# Patient Record
Sex: Female | Born: 1960 | Race: White | Hispanic: No | Marital: Married | Smoking: Never smoker
Health system: Southern US, Community
[De-identification: ages and names within clinical notes are randomized; demographics above are authoritative.]

## PROBLEM LIST (undated history)

## (undated) DIAGNOSIS — G4700 Insomnia, unspecified: Secondary | ICD-10-CM

## (undated) DIAGNOSIS — E042 Nontoxic multinodular goiter: Secondary | ICD-10-CM

## (undated) DIAGNOSIS — G4733 Obstructive sleep apnea (adult) (pediatric): Secondary | ICD-10-CM

## (undated) DIAGNOSIS — M545 Low back pain, unspecified: Secondary | ICD-10-CM

## (undated) DIAGNOSIS — N200 Calculus of kidney: Secondary | ICD-10-CM

## (undated) DIAGNOSIS — F419 Anxiety disorder, unspecified: Secondary | ICD-10-CM

## (undated) DIAGNOSIS — H35033 Hypertensive retinopathy, bilateral: Secondary | ICD-10-CM

## (undated) DIAGNOSIS — E669 Obesity, unspecified: Secondary | ICD-10-CM

## (undated) DIAGNOSIS — E041 Nontoxic single thyroid nodule: Secondary | ICD-10-CM

## (undated) DIAGNOSIS — Z8489 Family history of other specified conditions: Secondary | ICD-10-CM

## (undated) DIAGNOSIS — J45909 Unspecified asthma, uncomplicated: Secondary | ICD-10-CM

## (undated) DIAGNOSIS — E559 Vitamin D deficiency, unspecified: Secondary | ICD-10-CM

## (undated) DIAGNOSIS — I10 Essential (primary) hypertension: Secondary | ICD-10-CM

## (undated) DIAGNOSIS — Z6841 Body Mass Index (BMI) 40.0 and over, adult: Secondary | ICD-10-CM

## (undated) DIAGNOSIS — F4310 Post-traumatic stress disorder, unspecified: Secondary | ICD-10-CM

## (undated) DIAGNOSIS — E538 Deficiency of other specified B group vitamins: Secondary | ICD-10-CM

## (undated) DIAGNOSIS — G4719 Other hypersomnia: Secondary | ICD-10-CM

## (undated) DIAGNOSIS — M25561 Pain in right knee: Secondary | ICD-10-CM

## (undated) HISTORY — DX: Other hypersomnia: G47.19

## (undated) HISTORY — DX: Low back pain, unspecified: M54.50

## (undated) HISTORY — DX: Calculus of kidney: N20.0

## (undated) HISTORY — PX: CHOLECYSTECTOMY: SHX55

## (undated) HISTORY — DX: Anxiety disorder, unspecified: F41.9

## (undated) HISTORY — DX: Hypertensive retinopathy, bilateral: H35.033

## (undated) HISTORY — DX: Vitamin D deficiency, unspecified: E55.9

## (undated) HISTORY — DX: Insomnia, unspecified: G47.00

## (undated) HISTORY — DX: Obesity, unspecified: E66.9

## (undated) HISTORY — DX: Body Mass Index (BMI) 40.0 and over, adult: Z684

## (undated) HISTORY — PX: TONSILLECTOMY: SUR1361

## (undated) HISTORY — DX: Obstructive sleep apnea (adult) (pediatric): G47.33

## (undated) HISTORY — DX: Post-traumatic stress disorder, unspecified: F43.10

## (undated) HISTORY — DX: Nontoxic single thyroid nodule: E04.1

## (undated) HISTORY — DX: Nontoxic multinodular goiter: E04.2

## (undated) HISTORY — DX: Deficiency of other specified B group vitamins: E53.8

---
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
## 2003-03-13 ENCOUNTER — Other Ambulatory Visit: Admission: RE | Admit: 2003-03-13 | Discharge: 2003-03-13 | Payer: Self-pay | Admitting: Obstetrics and Gynecology

## 2003-06-11 ENCOUNTER — Ambulatory Visit (HOSPITAL_COMMUNITY): Admission: RE | Admit: 2003-06-11 | Discharge: 2003-06-11 | Payer: Self-pay | Admitting: Cardiology

## 2003-06-27 ENCOUNTER — Ambulatory Visit (HOSPITAL_COMMUNITY): Admission: RE | Admit: 2003-06-27 | Discharge: 2003-06-27 | Payer: Self-pay | Admitting: Family Medicine

## 2003-07-04 ENCOUNTER — Encounter (HOSPITAL_COMMUNITY): Admission: RE | Admit: 2003-07-04 | Discharge: 2003-10-02 | Payer: Self-pay | Admitting: Family Medicine

## 2004-06-06 ENCOUNTER — Other Ambulatory Visit: Admission: RE | Admit: 2004-06-06 | Discharge: 2004-06-06 | Payer: Self-pay | Admitting: Obstetrics and Gynecology

## 2004-06-10 ENCOUNTER — Ambulatory Visit (HOSPITAL_COMMUNITY): Admission: RE | Admit: 2004-06-10 | Discharge: 2004-06-10 | Payer: Self-pay | Admitting: Family Medicine

## 2010-12-15 ENCOUNTER — Other Ambulatory Visit: Payer: Self-pay | Admitting: Obstetrics and Gynecology

## 2010-12-15 DIAGNOSIS — N6019 Diffuse cystic mastopathy of unspecified breast: Secondary | ICD-10-CM

## 2010-12-15 DIAGNOSIS — N63 Unspecified lump in unspecified breast: Secondary | ICD-10-CM

## 2010-12-29 ENCOUNTER — Ambulatory Visit
Admission: RE | Admit: 2010-12-29 | Discharge: 2010-12-29 | Disposition: A | Discharge status: Home / Self Care | Payer: BC Managed Care – PPO | Source: Ambulatory Visit | Attending: Obstetrics and Gynecology | Admitting: Obstetrics and Gynecology

## 2010-12-29 DIAGNOSIS — N63 Unspecified lump in unspecified breast: Secondary | ICD-10-CM

## 2010-12-29 DIAGNOSIS — N6019 Diffuse cystic mastopathy of unspecified breast: Secondary | ICD-10-CM

## 2011-02-10 ENCOUNTER — Other Ambulatory Visit: Payer: Self-pay | Admitting: Family Medicine

## 2011-02-10 DIAGNOSIS — M542 Cervicalgia: Secondary | ICD-10-CM

## 2011-02-11 ENCOUNTER — Ambulatory Visit
Admission: RE | Admit: 2011-02-11 | Discharge: 2011-02-11 | Disposition: A | Discharge status: Home / Self Care | Payer: BC Managed Care – PPO | Source: Ambulatory Visit | Attending: Family Medicine | Admitting: Family Medicine

## 2011-02-11 DIAGNOSIS — M542 Cervicalgia: Secondary | ICD-10-CM

## 2012-09-02 ENCOUNTER — Emergency Department (HOSPITAL_COMMUNITY)
Admission: EM | Admit: 2012-09-02 | Discharge: 2012-09-03 | Disposition: A | Discharge status: Home / Self Care | Payer: BC Managed Care – PPO | Attending: Emergency Medicine | Admitting: Emergency Medicine

## 2012-09-02 ENCOUNTER — Emergency Department (HOSPITAL_COMMUNITY): Payer: BC Managed Care – PPO

## 2012-09-02 ENCOUNTER — Encounter (HOSPITAL_COMMUNITY): Payer: Self-pay | Admitting: Emergency Medicine

## 2012-09-02 DIAGNOSIS — S139XXA Sprain of joints and ligaments of unspecified parts of neck, initial encounter: Secondary | ICD-10-CM | POA: Insufficient documentation

## 2012-09-02 DIAGNOSIS — Y929 Unspecified place or not applicable: Secondary | ICD-10-CM | POA: Insufficient documentation

## 2012-09-02 DIAGNOSIS — M79609 Pain in unspecified limb: Secondary | ICD-10-CM | POA: Insufficient documentation

## 2012-09-02 DIAGNOSIS — R51 Headache: Secondary | ICD-10-CM | POA: Insufficient documentation

## 2012-09-02 DIAGNOSIS — S161XXA Strain of muscle, fascia and tendon at neck level, initial encounter: Secondary | ICD-10-CM

## 2012-09-02 DIAGNOSIS — R0602 Shortness of breath: Secondary | ICD-10-CM | POA: Insufficient documentation

## 2012-09-02 DIAGNOSIS — Y9389 Activity, other specified: Secondary | ICD-10-CM | POA: Insufficient documentation

## 2012-09-02 DIAGNOSIS — X500XXA Overexertion from strenuous movement or load, initial encounter: Secondary | ICD-10-CM | POA: Insufficient documentation

## 2012-09-02 DIAGNOSIS — R079 Chest pain, unspecified: Secondary | ICD-10-CM | POA: Insufficient documentation

## 2012-09-02 LAB — CBC WITH DIFFERENTIAL/PLATELET
Basophils Absolute: 0 10*3/uL (ref 0.0–0.1)
Eosinophils Absolute: 0 10*3/uL (ref 0.0–0.7)
Lymphocytes Relative: 26 % (ref 12–46)
MCH: 28.2 pg (ref 26.0–34.0)
Monocytes Absolute: 0.4 10*3/uL (ref 0.1–1.0)
Monocytes Relative: 5 % (ref 3–12)
Neutro Abs: 4.8 10*3/uL (ref 1.7–7.7)
WBC: 7 10*3/uL (ref 4.0–10.5)

## 2012-09-02 LAB — COMPREHENSIVE METABOLIC PANEL
AST: 13 U/L (ref 0–37)
Albumin: 4.1 g/dL (ref 3.5–5.2)
BUN: 8 mg/dL (ref 6–23)
Calcium: 9.9 mg/dL (ref 8.4–10.5)
Potassium: 4.2 mEq/L (ref 3.5–5.1)
Total Protein: 7.7 g/dL (ref 6.0–8.3)

## 2012-09-02 LAB — POCT I-STAT TROPONIN I: Troponin i, poc: 0 ng/mL (ref 0.00–0.08)

## 2012-09-02 MED ORDER — NAPROXEN 500 MG PO TABS: 500.0000 mg | ORAL_TABLET | Freq: Two times a day (BID) | ORAL | Status: DC

## 2012-09-02 MED ORDER — IOHEXOL 350 MG/ML SOLN
100.0000 mL | Freq: Once | INTRAVENOUS | Status: AC | PRN
  Administered 2012-09-02: 100 mL via INTRAVENOUS

## 2012-09-02 MED ORDER — CYCLOBENZAPRINE HCL 10 MG PO TABS: 10.0000 mg | ORAL_TABLET | Freq: Two times a day (BID) | ORAL | Status: DC | PRN

## 2012-09-02 NOTE — ED Notes (Signed)
Pt c/o l side chest pain that radiates to neck x5 days.  Pt also reports jaw pain x1 day.  Prior to the chest pain, pt reports leg pain.  Pt denies cardiac issues.

## 2012-09-02 NOTE — Progress Notes (Signed)
Patient reports her pcp is Dr. Joycelyn Rua of [facility].

## 2012-09-02 NOTE — ED Provider Notes (Addendum)
CSN: [id number]     Arrival date & time 09/02/12  1946 History     First MD Initiated Contact with Patient 09/02/12 2022     Chief Complaint  Patient presents with  . Neck Pain  . Chest Pain  . Leg Pain   (Consider location/radiation/quality/duration/timing/severity/associated sxs/prior Treatment) HPI Comments: Pt presented due to neck pain that started today when bending over to pick up a box.  States that she also has a mild HA but no radiation of the pain in the left arm but does radiate to the left jaw.  Worse with bending over and positions.  No cough, fever or numbness/tingling in the arm or leg.  No vision changes.  No prior hx of neck pain in the past.  Also pt states she recently flew back from [place] last week and for the last 5 days has had throbbing pain in her right calf that resolved today and right sided chest pain that she has had in the past with some SOB which has also resolved today.  Denies any pain in either of these areas today.  No leg swelling and no prior hx of blood clot.  The history is provided by the patient.    History reviewed. No pertinent past medical history. Past Surgical History  Procedure Laterality Date  . Tonsillectomy    . Cesarean section    . Cholecystectomy     No family history on file. History  Substance Use Topics  . Smoking status: Never Smoker   . Smokeless tobacco: Not on file  . Alcohol Use: No   OB History   Grav Para Term Preterm Abortions TAB SAB Ect Mult Living                 Review of Systems  Constitutional: Negative for fever.  HENT: Positive for neck pain. Negative for congestion.   Eyes: Negative for pain and visual disturbance.  Respiratory: Positive for shortness of breath. Negative for cough.   Cardiovascular: Positive for chest pain. Negative for leg swelling.  Gastrointestinal: Negative for nausea, vomiting and abdominal pain.  Neurological: Negative for dizziness, seizures, syncope, speech difficulty,  weakness and numbness.       Mild HA today no greater than a 2/10  All other systems reviewed and are negative.    Allergies  Review of patient's allergies indicates not on file.  Home Medications  No current outpatient prescriptions on file. BP 150/100  Pulse 83  Temp(Src) 98.9 F (37.2 C) (Oral)  Resp 16  SpO2 97%  LMP 08/11/2012 Physical Exam  Nursing note and vitals reviewed. Constitutional: She is oriented to person, place, and time. She appears well-developed and well-nourished. No distress.  HENT:  Head: Normocephalic and atraumatic.  Eyes: EOM are normal. Pupils are equal, round, and reactive to light.  Neck: Normal carotid pulses and no JVD present. Carotid bruit is not present.    Cardiovascular: Normal rate, regular rhythm, normal heart sounds and intact distal pulses.  Exam reveals no friction rub.   No murmur heard. Pulmonary/Chest: Effort normal and breath sounds normal. She has no wheezes. She has no rales.  Abdominal: Soft. Bowel sounds are normal. She exhibits no distension. There is no tenderness. There is no rebound and no guarding.  Musculoskeletal: Normal range of motion. She exhibits no tenderness.  No edema.  No right calf pain or swelling at this time  Neurological: She is alert and oriented to person, place, and time. She has normal strength.  No cranial nerve deficit or sensory deficit.  Skin: Skin is warm and dry. No rash noted.  Psychiatric: She has a normal mood and affect. Her behavior is normal.    ED Course   Procedures (including critical care time)  Labs Reviewed  COMPREHENSIVE METABOLIC PANEL - Abnormal; Notable for the following:    Glucose, Bld 100 (*)    All other components within normal limits  CBC WITH DIFFERENTIAL  D-DIMER, QUANTITATIVE  POCT I-STAT TROPONIN I   Dg Chest 1 View  09/02/2012   *RADIOLOGY REPORT*  Clinical Data: Chest pain.  Current history of asthma.  PORTABLE CHEST - 1 VIEW  Comparison: CT chest 06/11/2011.   Findings: Heart size upper normal to slightly enlarged for AP portable technique, unchanged.  Lungs clear.  Bronchovascular markings normal.  Pulmonary vascularity normal.  No pneumothorax. No pleural effusions.  IMPRESSION: Borderline heart size.  No acute cardiopulmonary disease.   Original Report Authenticated By: Hulan Saas, M.D.   Ct Angio Neck W/cm &/or Wo/cm  09/02/2012   *RADIOLOGY REPORT*  Clinical Data:  LEFT-SIDED CHEST PAIN THAT RADIATES AND NECK  CT ANGIOGRAPHY NECK  Technique:  Multidetector CT imaging of the neck was performed using the standard protocol during bolus administration of intravenous contrast.  Multiplanar CT image reconstructions including MIPs were obtained to evaluate the vascular anatomy. Carotid stenosis measurements (when applicable) are obtained utilizing NASCET criteria, using the distal internal carotid diameter as the denominator.  Contrast: OMNIPAQUE IOHEXOL 350 MG/ML SOLN  Comparison:   None.  Findings: The aortic arch is normal in appearance with normal three- vessel morphology.  The subclavian arteries are normal.  The common carotid arteries are well opacified without evidence of high-grade stenosis or dissection. On the right, the right carotid bifurcation is normal.  The right internal and external carotid arteries are well opacified without evidence of high-grade stenosis, dissection, or aneurysm. More short segment decreased attenuation within the proximal right internal carotid artery may represent focal area of atherosclerosis (series 6 series 2, image 28).  On the left, the left internal carotid artery is minimally diminutive as compared to the right.  There is no evidence of dissection, high-grade stenosis, or aneurysm.  There is slight medialization of the internal carotid arteries bilaterally at the level of the hypopharynx, just distal to the carotid bifurcation.  The vertebral arteries are well opacified without evidence of stenosis, dissection, or  aneurysm.  The visualized intracranial circulation is normal.   Review of the MIP images confirms the above findings.  No pathologically enlarged lymph nodes are identified within the neck.  No loculated fluid collections or mass lesions are seen.  The thyroid is heterogeneous in appearance with multiple previously predominately hypodense nodules seen bilaterally.  The largest of these on the left measures 1.6 x 1.9 cm.  The largest of these on the right measures 1.4 x 1.6 cm with scattered internal calcifications.  No acute osseous abnormalities are identified.  The partially visualized lung apices are grossly unremarkable.  IMPRESSION: 1.  No CT evidence of carotid artery dissection identified.  No high-grade stenosis or aneurysm identified in the neck.  2. Heterogeneous thyroid with multiple nodules bilaterally as above. Further evaluation with dedicated thyroid ultrasound is recommended as clinically indicated.   Original Report Authenticated By: Rise Mu, M.D.     Date: 09/02/2012  Rate: 85  Rhythm: normal sinus rhythm  QRS Axis: normal  Intervals: normal  ST/T Wave abnormalities: normal  Conduction Disutrbances: none  Narrative  Interpretation: unremarkable     1. Strain of sternocleidomastoid muscle, initial encounter     MDM   Patient with unusual story of left-sided neck pain that occurred when she bent over her and has worsened all day today. No neurologic symptoms with the pain no carotid bruits on exam but concern for possible vertebral dissection.  Patient also states she recently flew back from [place] and been having right calf pain and right-sided chest pain denied shortness of breath. She states the chest pain and leg pain have improved but now the neck pain is severe. No prior history of cardiac issues no family history of clots low risk well's and only finding on exam is pain over the left steroncleidomastoid muscle.  EKG wnl.  Low suspicion for MI or cardiac issue.   Concern for possible PE however patient is low risk and will do a d-dimer. Concern for possible vertebral dissection as the cause of her neck pain versus muscle spasm as she does lift heavy things routinely.  CBC, CMP, troponin, d-dimer and CXR pending.  11:40 PM Labs and all imaging wnl.  Will d/c with muscle relaxers and NSAIDs  Gwyneth Sprout, MD 09/02/12 7829  Gwyneth Sprout, MD 09/02/12 2342

## 2018-09-26 ENCOUNTER — Encounter: Payer: Self-pay | Admitting: Orthopedic Surgery

## 2018-09-26 ENCOUNTER — Ambulatory Visit (INDEPENDENT_AMBULATORY_CARE_PROVIDER_SITE_OTHER): Payer: 59 | Admitting: Orthopedic Surgery

## 2018-09-26 VITALS — Ht 67.5 in | Wt 254.0 lb

## 2018-09-26 DIAGNOSIS — M1711 Unilateral primary osteoarthritis, right knee: Secondary | ICD-10-CM | POA: Diagnosis not present

## 2018-09-26 MED ORDER — METHYLPREDNISOLONE ACETATE 40 MG/ML IJ SUSP
40.0000 mg | INTRAMUSCULAR | Status: AC | PRN
  Administered 2018-09-26: 12:00:00 40 mg via INTRA_ARTICULAR

## 2018-09-26 MED ORDER — LIDOCAINE HCL 1 % IJ SOLN
5.0000 mL | INTRAMUSCULAR | Status: AC | PRN
  Administered 2018-09-26: 12:00:00 5 mL

## 2018-09-26 NOTE — Progress Notes (Signed)
Office Visit Note   Patient: Patricia Robbins           Date of Birth: 03/28/1960           MRN: [id number] Visit Date: 09/26/2018              Requested by: Joycelyn RuaMeyers, Stephen, MD [address] [place],  [place] [id number] PCP: Joycelyn RuaMeyers, Stephen, MD  Chief Complaint  Patient presents with  . Right Knee - Pain    NP; Baker's Cyst      HPI: Patient is a 58 year old woman who presents for evaluation of osteoarthritis of her right knee.  Patient [place]  She states that her knee feels stiff and swollen and cannot bend her knee.  She states she has seen [place] [facility] and was told that she does have a very small Baker's cyst.  She is also undergone a Doppler which was negative for DVT.  Patient states she is tried meloxicam which gave her significant side effects she states she is also [facility] by a PA lum.  Patient states she has had chronic plantar fasciitis in her right knee pain is primarily over the medial joint line.  Assessment & Plan: Visit Diagnoses:  1. Unilateral primary osteoarthritis, right knee     Plan: Right knee was injected she tolerated this well reevaluate in 4 weeks.  Follow-Up Instructions: Return in about 4 weeks (around 10/24/2018).   Ortho Exam  Patient is alert, oriented, no adenopathy, well-dressed, normal affect, normal respiratory effort. Examination patient's difficulty getting from a sitting to a standing position.  Her calf is soft nontender no evidence of DVT no skin color changes no evidence of chronic venous insufficiency.  She has no significant prominent in the popliteal fossa no evidence of a large DVT.  There is minimal swelling of the knee she is maximally tender to palpation of the medial joint line.  Collaterals and cruciates are stable she does have crepitation with range of motion.  Her radiographs were reviewed which show some mild  joint space narrowing medially and laterally however she does have osteophytic bone spurs at all 3 compartments.  Imaging: No results found. No images are attached to the encounter.  Labs: No results found for: HGBA1C, ESRSEDRATE, CRP, LABURIC, REPTSTATUS, GRAMSTAIN, CULT, LABORGA   Lab Results  Component Value Date   ALBUMIN 4.1 09/02/2012    No results found for: MG No results found for: VD25OH  No results found for: PREALBUMIN CBC EXTENDED Latest Ref Rng & Units 09/02/2012  WBC 4.0 - 10.5 K/uL 7.0  RBC 3.87 - 5.11 MIL/uL 4.65  HGB 12.0 - 15.0 g/dL 19.113.1  HCT 47.836.0 - 29.546.0 % 39.2  PLT 150 - 400 K/uL 246  NEUTROABS 1.7 - 7.7 K/uL 4.8  LYMPHSABS 0.7 - 4.0 K/uL 1.8     Body mass index is 39.19 kg/m.  Orders:  No orders of the defined types were placed in this encounter.  No orders of the defined types were placed in this encounter.    Procedures: Large Joint Inj: R knee on 09/26/2018 12:15 PM Indications: pain and diagnostic evaluation Details: 22 G 1.5 in needle, anteromedial approach  Arthrogram: No  Medications: 5 mL lidocaine 1 %; 40 mg methylPREDNISolone acetate 40 MG/ML Outcome: tolerated well, no immediate complications Procedure, treatment alternatives, risks and benefits explained, specific risks discussed. Consent  was given by the patient. Immediately prior to procedure a time out was called to verify the correct patient, procedure, equipment, support staff and site/side marked as required. Patient was prepped and draped in the usual sterile fashion.      Clinical Data: No additional findings.  ROS:  All other systems negative, except as noted in the HPI. Review of Systems  Objective: Vital Signs: Ht 5' 7.5" (1.715 m)   Wt 254 lb (115.2 kg)   BMI 39.19 kg/m   Specialty Comments:  No specialty comments available.  PMFS History: There are no active problems to display for this patient.  History reviewed. No pertinent past medical history.   History reviewed. No pertinent family history.  Past Surgical History:  Procedure Laterality Date  . CESAREAN SECTION    . CHOLECYSTECTOMY    . TONSILLECTOMY     Social History   Occupational History  . Not on file  Tobacco Use  . Smoking status: Never Smoker  . Smokeless tobacco: Never Used  Substance and Sexual Activity  . Alcohol use: No  . Drug use: No  . Sexual activity: Yes

## 2018-10-24 ENCOUNTER — Ambulatory Visit (INDEPENDENT_AMBULATORY_CARE_PROVIDER_SITE_OTHER): Payer: 59 | Admitting: Orthopedic Surgery

## 2018-10-24 ENCOUNTER — Encounter: Payer: Self-pay | Admitting: Orthopedic Surgery

## 2018-10-24 VITALS — Ht 67.5 in | Wt 254.0 lb

## 2018-10-24 DIAGNOSIS — M1711 Unilateral primary osteoarthritis, right knee: Secondary | ICD-10-CM | POA: Diagnosis not present

## 2018-10-24 NOTE — Progress Notes (Signed)
Office Visit Note   Patient: Patricia Robbins           Date of Birth: 30-May-1960           MRN: [id number] Visit Date: 10/24/2018              Requested by: Orpah Melter, [place],   [id number] PCP: Orpah Melter, MD  Chief Complaint  Patient presents with  . Right Knee - Pain, Follow-up      HPI: Patient is a 58 year old woman who presents in follow-up for osteoarthritis right knee she states the injection helped a lot she states her knee feels much better.  She states she also has some swelling in both legs right worse than left.  Patient states that she is going to [place] [facility] for possible further ablation.  Assessment & Plan: Visit Diagnoses:  1. Unilateral primary osteoarthritis, right knee     Plan: Recommended compression stockings recommended that she follow-up with her primary doctor for management of diuretics as well as her ACE inhibitor.  Patient states that her diastolic blood pressure runs around 100.  Discussed weight loss follow-up as needed.  Follow-Up Instructions: Return if symptoms worsen or fail to improve.   Ortho Exam  Patient is alert, oriented, no adenopathy, well-dressed, normal affect, normal respiratory effort. Examination patient has venous stasis swelling worse on the right than the left leg there is no open ulcers.  She has no brawny skin color changes she does have pitting edema in both legs she has good range of motion of the right knee there is no knee effusion.  Imaging: No results found. No images are attached to the encounter.  Labs: No results found for: HGBA1C, ESRSEDRATE, CRP, LABURIC, REPTSTATUS, GRAMSTAIN, CULT, LABORGA   Lab Results  Component Value Date   ALBUMIN 4.1 09/02/2012    No results found for: MG No results found for: VD25OH  No results found for: PREALBUMIN CBC EXTENDED Latest Ref Rng & Units 09/02/2012  WBC 4.0 - 10.5 K/uL 7.0  RBC 3.87 - 5.11 MIL/uL 4.65  HGB 12.0  - 15.0 g/dL 13.1  HCT 36.0 - 46.0 % 39.2  PLT 150 - 400 K/uL 246  NEUTROABS 1.7 - 7.7 K/uL 4.8  LYMPHSABS 0.7 - 4.0 K/uL 1.8     Body mass index is 39.19 kg/m.  Orders:  No orders of the defined types were placed in this encounter.  No orders of the defined types were placed in this encounter.    Procedures: No procedures performed  Clinical Data: No additional findings.  ROS:  All other systems negative, except as noted in the HPI. Review of Systems  Objective: Vital Signs: Ht 5' 7.5" (1.715 m)   Wt 254 lb (115.2 kg)   BMI 39.19 kg/m   Specialty Comments:  No specialty comments available.  PMFS History: There are no active problems to display for this patient.  History reviewed. No pertinent past medical history.  History reviewed. No pertinent family history.  Past Surgical History:  Procedure Laterality Date  . CESAREAN SECTION    . CHOLECYSTECTOMY    . TONSILLECTOMY     Social History   Occupational History  . Not on file  Tobacco Use  . Smoking status: Never Smoker  . Smokeless tobacco: Never Used  Substance and Sexual Activity  . Alcohol use: No  . Drug use: No  . Sexual activity: Yes

## 2019-01-10 ENCOUNTER — Other Ambulatory Visit: Payer: Self-pay

## 2019-01-10 ENCOUNTER — Telehealth: Payer: Self-pay | Admitting: Orthopedic Surgery

## 2019-01-10 DIAGNOSIS — M1711 Unilateral primary osteoarthritis, right knee: Secondary | ICD-10-CM

## 2019-01-10 NOTE — Telephone Encounter (Signed)
Please order MRI scan right knee rule out meniscal tear.

## 2019-01-10 NOTE — Telephone Encounter (Signed)
I called pt and advised that the order has been put in for MRi. Will call insurance and precert procedure and then call her back to schedule procedure. Pt voiced understanding and will call with any other questions.

## 2019-01-10 NOTE — Telephone Encounter (Signed)
Patient's right knee pain has not improved and would like to know if Dr. Sharol Given would order an MRI.  She is afraid to get another injection in fear that she might hurt herself again with not knowing how much pain she is in.  CB#[phone].  Thank you.

## 2019-01-10 NOTE — Telephone Encounter (Signed)
Can you please see below and advise?  

## 2019-01-30 ENCOUNTER — Other Ambulatory Visit: Payer: 59

## 2019-02-02 ENCOUNTER — Ambulatory Visit
Admission: RE | Admit: 2019-02-02 | Discharge: 2019-02-02 | Disposition: A | Discharge status: Home / Self Care | Payer: 59 | Source: Ambulatory Visit | Attending: Orthopedic Surgery | Admitting: Orthopedic Surgery

## 2019-02-02 ENCOUNTER — Other Ambulatory Visit: Payer: Self-pay

## 2019-02-02 DIAGNOSIS — M1711 Unilateral primary osteoarthritis, right knee: Secondary | ICD-10-CM

## 2019-02-06 ENCOUNTER — Encounter: Payer: Self-pay | Admitting: Orthopedic Surgery

## 2019-02-06 ENCOUNTER — Ambulatory Visit (INDEPENDENT_AMBULATORY_CARE_PROVIDER_SITE_OTHER): Payer: 59 | Admitting: Orthopedic Surgery

## 2019-02-06 ENCOUNTER — Other Ambulatory Visit: Payer: Self-pay

## 2019-02-06 VITALS — Ht 67.0 in | Wt 254.0 lb

## 2019-02-06 DIAGNOSIS — M23221 Derangement of posterior horn of medial meniscus due to old tear or injury, right knee: Secondary | ICD-10-CM

## 2019-02-09 ENCOUNTER — Other Ambulatory Visit: Payer: 59

## 2019-02-14 ENCOUNTER — Encounter: Payer: Self-pay | Admitting: Orthopedic Surgery

## 2019-02-14 NOTE — Progress Notes (Signed)
Office Visit Note   Patient: Patricia Robbins           Date of Birth: 08/07/1960           MRN: [id number] Visit Date: 02/06/2019              Requested by: Joycelyn Rua, MD [address] [place],  [place] [address] PCP: Joycelyn Rua, MD  Chief Complaint  Patient presents with  . Right Knee - Follow-up    MRI review      HPI: Patient is a 59 year old woman who presents in follow-up for mechanical pain right knee status post MRI scan.  She complains of increased swelling and pain along the medial joint line.  Assessment & Plan: Visit Diagnoses:  1. Derang of post horn of medial mensc d/t old tear/inj, r knee     Plan: Patient states that due to failure of conservative treatment she would like to proceed with arthroscopic intervention.  Risk and benefits were discussed including persistent pain DVT need for additional surgery.  Patient states she understands wishes to proceed at this time.  Follow-Up Instructions: Return in about 2 weeks (around 02/20/2019).   Ortho Exam  Patient is alert, oriented, no adenopathy, well-dressed, normal affect, normal respiratory effort. Examination patient has swelling of the right knee she is tender to palpation of the medial joint line flexion rotation is painful.  Review of the MRI scan shows intact ligaments she has a complex degenerative tear of the posterior horn of the medial meniscus there is also some osteoarthritis in the medial joint line as well as the patellofemoral joint she does have a Baker's cyst.  Imaging: No results found. No images are attached to the encounter.  Labs: No results found for: HGBA1C, ESRSEDRATE, CRP, LABURIC, REPTSTATUS, GRAMSTAIN, CULT, LABORGA   Lab Results  Component Value Date   ALBUMIN 4.1 09/02/2012    No results found for: MG No results found for: VD25OH  No results found for: PREALBUMIN CBC EXTENDED Latest Ref Rng & Units 09/02/2012  WBC 4.0 - 10.5 K/uL 7.0  RBC 3.87 - 5.11  MIL/uL 4.65  HGB 12.0 - 15.0 g/dL 14.4  HCT 81.8 - 56.3 % 39.2  PLT 150 - 400 K/uL 246  NEUTROABS 1.7 - 7.7 K/uL 4.8  LYMPHSABS 0.7 - 4.0 K/uL 1.8     Body mass index is 39.78 kg/m.  Orders:  No orders of the defined types were placed in this encounter.  No orders of the defined types were placed in this encounter.    Procedures: No procedures performed  Clinical Data: No additional findings.  ROS:  All other systems negative, except as noted in the HPI. Review of Systems  Objective: Vital Signs: Ht 5\' 7"  (1.702 m)   Wt 254 lb (115.2 kg)   BMI 39.78 kg/m   Specialty Comments:  No specialty comments available.  PMFS History: There are no problems to display for this patient.  History reviewed. No pertinent past medical history.  History reviewed. No pertinent family history.  Past Surgical History:  Procedure Laterality Date  . CESAREAN SECTION    . CHOLECYSTECTOMY    . TONSILLECTOMY     Social History   Occupational History  . Not on file  Tobacco Use  . Smoking status: Never Smoker  . Smokeless tobacco: Never Used  Substance and Sexual Activity  . Alcohol use: No  . Drug use: No  . Sexual activity: Yes

## 2019-03-06 ENCOUNTER — Other Ambulatory Visit: Payer: Self-pay

## 2019-03-09 ENCOUNTER — Other Ambulatory Visit: Payer: Self-pay | Admitting: Physician Assistant

## 2019-03-27 ENCOUNTER — Other Ambulatory Visit: Payer: Self-pay

## 2019-03-27 ENCOUNTER — Encounter (HOSPITAL_BASED_OUTPATIENT_CLINIC_OR_DEPARTMENT_OTHER): Payer: Self-pay | Admitting: Orthopedic Surgery

## 2019-03-31 ENCOUNTER — Encounter (HOSPITAL_BASED_OUTPATIENT_CLINIC_OR_DEPARTMENT_OTHER)
Admission: RE | Admit: 2019-03-31 | Discharge: 2019-03-31 | Disposition: A | Discharge status: Home / Self Care | Payer: 59 | Source: Ambulatory Visit | Attending: Orthopedic Surgery | Admitting: Orthopedic Surgery

## 2019-03-31 ENCOUNTER — Inpatient Hospital Stay (HOSPITAL_COMMUNITY): Admission: RE | Admit: 2019-03-31 | Payer: 59 | Source: Ambulatory Visit

## 2019-03-31 ENCOUNTER — Telehealth: Payer: Self-pay | Admitting: Orthopedic Surgery

## 2019-03-31 NOTE — Telephone Encounter (Signed)
Patient called.  She needs to know what stage her right miniscule tear is in. Her other providers are inquiring.   Call back: [phone]

## 2019-03-31 NOTE — Telephone Encounter (Signed)
Pt is sch for surgery 04/04/19 please see below and advise.

## 2019-03-31 NOTE — Telephone Encounter (Signed)
Patient called and stated to disregard previous request. Information no longer needed.

## 2019-04-01 ENCOUNTER — Inpatient Hospital Stay (HOSPITAL_COMMUNITY): Admission: RE | Admit: 2019-04-01 | Payer: 59 | Source: Ambulatory Visit

## 2019-04-04 ENCOUNTER — Ambulatory Visit (HOSPITAL_BASED_OUTPATIENT_CLINIC_OR_DEPARTMENT_OTHER): Admission: RE | Admit: 2019-04-04 | Payer: 59 | Source: Home / Self Care | Admitting: Orthopedic Surgery

## 2019-04-04 HISTORY — DX: Essential (primary) hypertension: I10

## 2019-04-04 HISTORY — DX: Pain in right knee: M25.561

## 2019-04-04 HISTORY — DX: Family history of other specified conditions: Z84.89

## 2019-04-04 HISTORY — DX: Unspecified asthma, uncomplicated: J45.909

## 2019-04-04 SURGERY — ARTHROSCOPY, KNEE
Anesthesia: Choice | Site: Knee | Laterality: Right

## 2019-04-13 ENCOUNTER — Inpatient Hospital Stay: Payer: 59 | Admitting: Orthopedic Surgery

## 2020-10-03 ENCOUNTER — Other Ambulatory Visit: Payer: Self-pay | Admitting: Radiology

## 2020-10-03 DIAGNOSIS — N644 Mastodynia: Secondary | ICD-10-CM

## 2020-10-22 ENCOUNTER — Ambulatory Visit: Payer: 59

## 2020-10-22 ENCOUNTER — Other Ambulatory Visit: Payer: Self-pay

## 2020-10-22 ENCOUNTER — Ambulatory Visit
Admission: RE | Admit: 2020-10-22 | Discharge: 2020-10-22 | Disposition: A | Discharge status: Home / Self Care | Payer: 59 | Source: Ambulatory Visit | Attending: Radiology | Admitting: Radiology

## 2020-10-22 DIAGNOSIS — N644 Mastodynia: Secondary | ICD-10-CM

## 2021-02-25 ENCOUNTER — Ambulatory Visit (INDEPENDENT_AMBULATORY_CARE_PROVIDER_SITE_OTHER): Payer: 59 | Admitting: Family

## 2021-02-25 ENCOUNTER — Ambulatory Visit: Payer: Self-pay

## 2021-02-25 DIAGNOSIS — M25562 Pain in left knee: Secondary | ICD-10-CM | POA: Diagnosis not present

## 2021-02-25 DIAGNOSIS — G8929 Other chronic pain: Secondary | ICD-10-CM | POA: Diagnosis not present

## 2021-02-25 NOTE — Progress Notes (Signed)
Office Visit Note   Patient: Patricia Robbins           Date of Birth: October 15, 1960           MRN: [id number] Visit Date: 02/25/2021              Requested by: Christa See, [address] [place],  [place] [address] PCP: Christa See, FNP  Chief Complaint  Patient presents with   Left Knee - Pain      HPI: The patient is a 61 year old woman who presents today for initial evaluation of left knee pain.  This is been going on for many months she has even had a second injury which was just 2 months ago to the same knee her injuries have occurred while walking in the woods stepping "wrong on uneven terrain."  She has been having ongoing issues with medial sided knee pain some posterior knee pain she can worries that this may be a Baker's cyst she has had Baker's cyst on the right.  She is having anterior knee pain on the top of her kneecap and in the muscle she states this is C-shaped and radiates through the medial knee this feels like a burning pain  This is worse at night she has extreme difficulty getting comfortable in bed. has pain with extension  Assessment & Plan: Visit Diagnoses:  1. Chronic pain of left knee     Plan: We will proceed with MRI of the left knee.  Concern for meniscal injury.  Follow-Up Instructions: No follow-ups on file.   Left Knee Exam   Muscle Strength  The patient has normal left knee strength.  Tenderness  The patient is experiencing tenderness in the medial joint line.  Range of Motion  The patient has normal left knee ROM.  Tests  Varus: negative Valgus: negative  Other  Erythema: absent Effusion: no effusion present     Patient is alert, oriented, no adenopathy, well-dressed, normal affect, normal respiratory effort.   Imaging: No results found. No images are attached to the encounter.  Labs: No results found for: HGBA1C, ESRSEDRATE, CRP, LABURIC, REPTSTATUS, GRAMSTAIN, CULT, LABORGA   Lab Results  Component Value  Date   ALBUMIN 4.1 09/02/2012    No results found for: MG No results found for: VD25OH  No results found for: PREALBUMIN CBC EXTENDED Latest Ref Rng & Units 09/02/2012  WBC 4.0 - 10.5 K/uL 7.0  RBC 3.87 - 5.11 MIL/uL 4.65  HGB 12.0 - 15.0 g/dL 13.1  HCT 36.0 - 46.0 % 39.2  PLT 150 - 400 K/uL 246  NEUTROABS 1.7 - 7.7 K/uL 4.8  LYMPHSABS 0.7 - 4.0 K/uL 1.8     There is no height or weight on file to calculate BMI.  Orders:  Orders Placed This Encounter  Procedures   XR Knee 1-2 Views Left   MR Knee Left w/o contrast   No orders of the defined types were placed in this encounter.    Procedures: Large Joint Inj: L knee on 02/25/2021 10:50 AM Indications: pain Details: 18 G 1.5 in needle, anteromedial approach Medications: 5 mL lidocaine 1 %; 40 mg methylPREDNISolone acetate 40 MG/ML Consent was given by the patient.     Clinical Data: No additional findings.  ROS:  All other systems negative, except as noted in the HPI. Review of Systems  Constitutional:  Negative for appetite change and fever.  Musculoskeletal:  Positive for arthralgias, joint swelling and myalgias.  Objective: Vital Signs: There were no vitals taken for this visit.  Specialty Comments:  No specialty comments available.  PMFS History: There are no problems to display for this patient.  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  Past Surgical History:  Procedure Laterality Date   CESAREAN SECTION     CESAREAN SECTION     CHOLECYSTECTOMY     TONSILLECTOMY     Social History   Occupational History   Not on file  Tobacco Use   Smoking status: Never   Smokeless tobacco: Never  Substance and Sexual Activity   Alcohol use: Yes   Drug use: No   Sexual activity: Yes    Comment:  husband has had vasectomy

## 2021-03-06 ENCOUNTER — Ambulatory Visit (INDEPENDENT_AMBULATORY_CARE_PROVIDER_SITE_OTHER): Payer: 59 | Admitting: Orthopedic Surgery

## 2021-03-06 ENCOUNTER — Other Ambulatory Visit: Payer: Self-pay

## 2021-03-06 DIAGNOSIS — M25562 Pain in left knee: Secondary | ICD-10-CM | POA: Diagnosis not present

## 2021-03-06 DIAGNOSIS — G8929 Other chronic pain: Secondary | ICD-10-CM

## 2021-03-09 ENCOUNTER — Encounter: Payer: Self-pay | Admitting: Orthopedic Surgery

## 2021-03-09 NOTE — Progress Notes (Signed)
Office Visit Note   Patient: Patricia Robbins           Date of Birth: 1960/05/28           MRN: [id number] Visit Date: 03/06/2021              Requested by: Christa See, [address] [place],  [place] [address] PCP: Christa See, FNP  Chief Complaint  Patient presents with   Left Knee - Pain      HPI: Patient is a 61 year old woman who presents in follow-up for chronic left knee pain as well as pain anteriorly and posteriorly.  Patient states the injury occurred when she stepped on uneven terrain.  She has a history of a Baker's cyst in her right knee.  Radiographs were obtained on January 24 and she has an MRI scheduled for February 10.  Assessment & Plan: Visit Diagnoses:  1. Chronic pain of left knee     Plan: We will follow-up after the MRI scan.  Follow-Up Instructions: Return in about 1 week (around 03/13/2021).   Ortho Exam  Patient is alert, oriented, no adenopathy, well-dressed, normal affect, normal respiratory effort. Examination patient has no effusion collaterals and cruciates are stable she has crepitation with range of motion of the patellofemoral joint she complains of popping in her knee.  Pain primarily to palpation over the medial joint line pain with flexion and internal rotation.  Imaging: No results found. No images are attached to the encounter.  Labs: No results found for: HGBA1C, ESRSEDRATE, CRP, LABURIC, REPTSTATUS, GRAMSTAIN, CULT, LABORGA   Lab Results  Component Value Date   ALBUMIN 4.1 09/02/2012    No results found for: MG No results found for: VD25OH  No results found for: PREALBUMIN CBC EXTENDED Latest Ref Rng & Units 09/02/2012  WBC 4.0 - 10.5 K/uL 7.0  RBC 3.87 - 5.11 MIL/uL 4.65  HGB 12.0 - 15.0 g/dL 13.1  HCT 36.0 - 46.0 % 39.2  PLT 150 - 400 K/uL 246  NEUTROABS 1.7 - 7.7 K/uL 4.8  LYMPHSABS 0.7 - 4.0 K/uL 1.8     There is no height or weight on file to calculate BMI.  Orders:  No orders of the defined  types were placed in this encounter.  No orders of the defined types were placed in this encounter.    Procedures: No procedures performed  Clinical Data: No additional findings.  ROS:  All other systems negative, except as noted in the HPI. Review of Systems  Objective: Vital Signs: There were no vitals taken for this visit.  Specialty Comments:  No specialty comments available.  PMFS History: There are no problems to display for this patient.  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 No pertinent family history.  Past Surgical History:  Procedure Laterality Date   CESAREAN SECTION     CESAREAN SECTION     CHOLECYSTECTOMY     TONSILLECTOMY     Social History   Occupational History   Not on file  Tobacco Use   Smoking status: Never   Smokeless tobacco: Never  Substance and Sexual Activity   Alcohol use: Yes   Drug use: No   Sexual activity:  Yes    Comment: husband has had vasectomy

## 2021-03-14 ENCOUNTER — Other Ambulatory Visit: Payer: Self-pay

## 2021-03-14 ENCOUNTER — Ambulatory Visit
Admission: RE | Admit: 2021-03-14 | Discharge: 2021-03-14 | Disposition: A | Discharge status: Home / Self Care | Payer: 59 | Source: Ambulatory Visit | Attending: Family | Admitting: Family

## 2021-03-14 DIAGNOSIS — G8929 Other chronic pain: Secondary | ICD-10-CM

## 2021-03-14 DIAGNOSIS — M25562 Pain in left knee: Secondary | ICD-10-CM

## 2021-03-20 ENCOUNTER — Ambulatory Visit (INDEPENDENT_AMBULATORY_CARE_PROVIDER_SITE_OTHER): Payer: 59 | Admitting: Orthopedic Surgery

## 2021-03-20 ENCOUNTER — Other Ambulatory Visit: Payer: Self-pay

## 2021-03-20 DIAGNOSIS — M25562 Pain in left knee: Secondary | ICD-10-CM

## 2021-03-20 DIAGNOSIS — G8929 Other chronic pain: Secondary | ICD-10-CM | POA: Diagnosis not present

## 2021-03-21 MED ORDER — LIDOCAINE HCL 1 % IJ SOLN
5.0000 mL | INTRAMUSCULAR | Status: AC | PRN
  Administered 2021-02-25: 5 mL

## 2021-03-21 MED ORDER — METHYLPREDNISOLONE ACETATE 40 MG/ML IJ SUSP
40.0000 mg | INTRAMUSCULAR | Status: AC | PRN
  Administered 2021-02-25: 40 mg via INTRA_ARTICULAR

## 2021-04-01 ENCOUNTER — Encounter: Payer: Self-pay | Admitting: Orthopedic Surgery

## 2021-04-01 NOTE — Progress Notes (Signed)
Office Visit Note   Patient: Patricia Robbins           Date of Birth: [birth year]           MRN: [id number] Visit Date: 03/20/2021              Requested by: Christa See, [address] [place],  [place] [address] PCP: Christa See, FNP  Chief Complaint  Patient presents with   Left Knee - Follow-up      HPI: Patient is a 61 year old woman who presents in follow-up for chronic pain left knee status post MRI scan.  Patient states that she recently had a sprain of her quad tendon and a recent steroid injection relieved most of the knee pain except for the medial joint line.  Assessment & Plan: Visit Diagnoses:  1. Chronic pain of left knee     Plan: Plan to follow-up in 4 weeks for repeat evaluation.  Patient would like to continue with conservative therapy before considering surgery.  Surgical options would include arthroscopy versus total knee arthroplasty.  Follow-Up Instructions: Return in about 4 weeks (around 04/17/2021).   Ortho Exam  Patient is alert, oriented, no adenopathy, well-dressed, normal affect, normal respiratory effort. Examination patient is primarily tender to palpation of the medial joint line Clauser cruciates are stable there is no effusion.  Review of the MRI scan shows tearing of the posterior horn of the medial meniscus as well as tricompartmental arthritic changes.  Imaging: No results found. No images are attached to the encounter.  Labs: No results found for: HGBA1C, ESRSEDRATE, CRP, LABURIC, REPTSTATUS, GRAMSTAIN, CULT, LABORGA   Lab Results  Component Value Date   ALBUMIN 4.1 09/02/2012    No results found for: MG No results found for: VD25OH  No results found for: PREALBUMIN CBC EXTENDED Latest Ref Rng & Units 09/02/2012  WBC 4.0 - 10.5 K/uL 7.0  RBC 3.87 - 5.11 MIL/uL 4.65  HGB 12.0 - 15.0 g/dL 13.1  HCT 36.0 - 46.0 % 39.2  PLT 150 - 400 K/uL 246  NEUTROABS 1.7 - 7.7 K/uL 4.8  LYMPHSABS 0.7 - 4.0 K/uL 1.8     There  is no height or weight on file to calculate BMI.  Orders:  No orders of the defined types were placed in this encounter.  No orders of the defined types were placed in this encounter.    Procedures: No procedures performed  Clinical Data: No additional findings.  ROS:  All other systems negative, except as noted in the HPI. Review of Systems  Objective: Vital Signs: There were no vitals taken for this visit.  Specialty Comments:  No specialty comments available.  PMFS History: There are no problems to display for this patient.  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 No pertinent family history.  Past Surgical History:  Procedure Laterality Date   CESAREAN SECTION     CESAREAN SECTION     CHOLECYSTECTOMY     TONSILLECTOMY     Social History   Occupational History   Not on file  Tobacco Use   Smoking status: Never   Smokeless tobacco: Never  Substance and Sexual Activity   Alcohol use: Yes  Drug use: No   Sexual activity: Yes    Comment: husband has had vasectomy

## 2021-04-17 ENCOUNTER — Ambulatory Visit: Payer: 59 | Admitting: Orthopedic Surgery

## 2021-05-22 ENCOUNTER — Ambulatory Visit (INDEPENDENT_AMBULATORY_CARE_PROVIDER_SITE_OTHER): Payer: 59 | Admitting: Orthopedic Surgery

## 2021-05-22 DIAGNOSIS — M25562 Pain in left knee: Secondary | ICD-10-CM | POA: Diagnosis not present

## 2021-05-22 DIAGNOSIS — G8929 Other chronic pain: Secondary | ICD-10-CM | POA: Diagnosis not present

## 2021-06-03 ENCOUNTER — Encounter: Payer: Self-pay | Admitting: Orthopedic Surgery

## 2021-06-03 DIAGNOSIS — M25562 Pain in left knee: Secondary | ICD-10-CM

## 2021-06-03 DIAGNOSIS — G8929 Other chronic pain: Secondary | ICD-10-CM

## 2021-06-03 MED ORDER — LIDOCAINE HCL (PF) 1 % IJ SOLN
5.0000 mL | INTRAMUSCULAR | Status: AC | PRN
  Administered 2021-06-03: 5 mL

## 2021-06-03 MED ORDER — METHYLPREDNISOLONE ACETATE 40 MG/ML IJ SUSP
40.0000 mg | INTRAMUSCULAR | Status: AC | PRN
  Administered 2021-06-03: 40 mg via INTRA_ARTICULAR

## 2021-06-03 NOTE — Progress Notes (Signed)
? ?Office Visit Note ?  ?Patient: Patricia Robbins           ?Date of Birth: Apr 29, 1960           ?MRN: [id number] ?Visit Date: 05/22/2021 ?             ?Requested by: Ayesha Rumpf, FNP ?[phone] ?[place],  [place] [address] ?PCP: Ayesha Rumpf, FNP ? ?Chief Complaint  ?Patient presents with  ? Left Knee - Pain  ? ? ? ? ?HPI: ?Patient is a 61 year old woman who is seen in follow-up for left knee pain.  She is status post a steroid injection.  MRI scan in February showed medial meniscus degenerative tear.  She states her knee feels like it is popping out of place states that the patella is stable. ? ?Assessment & Plan: ?Visit Diagnoses:  ?1. Chronic pain of left knee   ? ? ?Plan: Patient underwent repeat injection today.  We will follow-up in 4 weeks.  Discussed that we may need to consider arthroscopic debridement of the medial meniscus. ? ?Follow-Up Instructions: Return in about 4 weeks (around 06/19/2021).  ? ?Ortho Exam ? ?Patient is alert, oriented, no adenopathy, well-dressed, normal affect, normal respiratory effort. ?Examination review of the MRI scan of the left knee shows arthritic changes and a medial meniscal tear.  Patient's collaterals and cruciates are stable there is no instability with tracking of the patella she is tender to palpation over the medial joint line there is crepitation with range of motion. ? ?Imaging: ?No results found. ?No images are attached to the encounter. ? ?Labs: ?No results found for: HGBA1C, ESRSEDRATE, CRP, LABURIC, REPTSTATUS, GRAMSTAIN, CULT, LABORGA ? ? ?Lab Results  ?Component Value Date  ? ALBUMIN 4.1 09/02/2012  ? ? ?No results found for: MG ?No results found for: VD25OH ? ?No results found for: PREALBUMIN ? ?  Latest Ref Rng & Units 09/02/2012  ?  8:28 PM  ?CBC EXTENDED  ?WBC 4.0 - 10.5 K/uL 7.0    ?RBC 3.87 - 5.11 MIL/uL 4.65    ?Hemoglobin 12.0 - 15.0 g/dL 26.7    ?HCT 36.0 - 46.0 % 39.2    ?Platelets 150 - 400 K/uL 246    ?NEUT# 1.7 - 7.7 K/uL 4.8    ?Lymph# 0.7  - 4.0 K/uL 1.8    ? ? ? ?There is no height or weight on file to calculate BMI. ? ?Orders:  ?No orders of the defined types were placed in this encounter. ? ?No orders of the defined types were placed in this encounter. ? ? ? Procedures: ?Large Joint Inj: L knee on 06/03/2021 11:25 AM ?Indications: pain and diagnostic evaluation ?Details: 22 G 1.5 in needle, anteromedial approach ? ?Arthrogram: No ? ?Medications: 5 mL lidocaine (PF) 1 %; 40 mg methylPREDNISolone acetate 40 MG/ML ?Outcome: tolerated well, no immediate complications ?Procedure, treatment alternatives, risks and benefits explained, specific risks discussed. Consent was given by the patient. Immediately prior to procedure a time out was called to verify the correct patient, procedure, equipment, support staff and site/side marked as required. Patient was prepped and draped in the usual sterile fashion.  ? ? ? ?Clinical Data: ?No additional findings. ? ?ROS: ? ?All other systems negative, except as noted in the HPI. ?Review of Systems ? ?Objective: ?Vital Signs: There were no vitals taken for this visit. ? ?Specialty Comments:  ?No specialty comments available. ? ?PMFS History: ?There are no problems to display for this patient. ? ?  Past Medical History:  ?Diagnosis Date  ? Asthma   ? mild  ? Family history of adverse reaction to anesthesia   ? mother had anaphylaxis to succinylcholine  ? Hepatitis 1969  ? Hep A  ? Hypertension   ? Right knee pain   ?  ?History reviewed. No pertinent family history.  ?Past Surgical History:  ?Procedure Laterality Date  ? CESAREAN SECTION    ? CESAREAN SECTION    ? CHOLECYSTECTOMY    ? TONSILLECTOMY    ? ?Social History  ? ?Occupational History  ? Not on file  ?Tobacco Use  ? Smoking status: Never  ? Smokeless tobacco: Never  ?Substance and Sexual Activity  ? Alcohol use: Yes  ? Drug use: No  ? Sexual activity: Yes  ?  Comment: husband has had vasectomy  ? ? ? ? ? ?

## 2021-06-19 ENCOUNTER — Ambulatory Visit: Payer: 59 | Admitting: Orthopedic Surgery

## 2022-07-27 ENCOUNTER — Other Ambulatory Visit: Payer: Self-pay | Admitting: Family Medicine

## 2022-07-27 DIAGNOSIS — H31009 Unspecified chorioretinal scars, unspecified eye: Secondary | ICD-10-CM

## 2022-07-29 ENCOUNTER — Ambulatory Visit
Admission: RE | Admit: 2022-07-29 | Discharge: 2022-07-29 | Disposition: A | Discharge status: Home / Self Care | Payer: 59 | Source: Ambulatory Visit | Attending: Family Medicine | Admitting: Family Medicine

## 2022-07-29 DIAGNOSIS — H31009 Unspecified chorioretinal scars, unspecified eye: Secondary | ICD-10-CM

## 2022-08-03 ENCOUNTER — Other Ambulatory Visit: Payer: Self-pay | Admitting: Family Medicine

## 2022-08-03 DIAGNOSIS — E042 Nontoxic multinodular goiter: Secondary | ICD-10-CM

## 2022-08-05 ENCOUNTER — Ambulatory Visit
Admission: RE | Admit: 2022-08-05 | Discharge: 2022-08-05 | Disposition: A | Discharge status: Home / Self Care | Payer: 59 | Source: Ambulatory Visit | Attending: Family Medicine | Admitting: Family Medicine

## 2022-08-05 DIAGNOSIS — E042 Nontoxic multinodular goiter: Secondary | ICD-10-CM

## 2022-10-01 NOTE — Progress Notes (Signed)
Cardiology Office Note:   Date:  10/09/2022  ID:  Patricia Robbins, DOB 05-12-1960, MRN [id number] PCP:  Patricia Rumpf, FNP  [facility] Providers Cardiologist:  Alverda Skeans, MD Referring MD: Inez Pilgrim, NP   Chief Complaint/Reason for Referral: Possible right sided ocular embolism ASSESSMENT:    1. Dyspnea on exertion   2. Visual loss, transient, right   3. Primary hypertension   4. BMI 40.0-44.9, adult (HCC)   5. Snoring     PLAN:   In order of problems listed above: 1.  Dyspnea:  We will obtain a coronary CTA and echocardiogram to evaluate further.  If the patient has mild obstructive coronary artery disease, they will require a statin (with goal LDL < 70) and aspirin, if they have high-grade disease we will need to consider optimal medical therapy and if symptoms are refractory to medical therapy, then a cardiac catheterization with possible PCI will be pursued to alleviate symptoms.  If they have high risk disease we will proceed directly to cardiac catheterization.   2.  Transient right eye visual loss: Per ophthalmology was not thought to be due to embolic phenomenon.  No further testing is required at this time. 3.  Hypertension:  Elevated diastolic BP.  Repeat BP on my measure was 140/44mmHg; will increase to lisinopril to 40mg . 4.  Elevated BMI: Per PCP; continue diet and exercise; consider GLP-1 receptor agonist. 5.  Snoring:  Refer for sleep study.            Dispo:  Return in about 6 months (around 04/08/2023).      Medication Adjustments/Labs and Tests Ordered: Current medicines are reviewed at length with the patient today.  Concerns regarding medicines are outlined above.  The following changes have been made:     Labs/tests ordered: Orders Placed This Encounter  Procedures   CT CORONARY MORPH W/CTA COR W/SCORE W/CA W/CM &/OR WO/CM   EKG 12-Lead   ECHOCARDIOGRAM COMPLETE   Split night study    Medication Changes: Meds ordered this encounter   Medications   metoprolol tartrate (LOPRESSOR) 100 MG tablet    Sig: Take 1 tablet (100 mg total) by mouth once for 1 dose. Take 90-120 minutes prior to scan.    Dispense:  1 tablet    Refill:  0   lisinopril (ZESTRIL) 40 MG tablet    Sig: Take 1 tablet (40 mg total) by mouth daily.    Dispense:  90 tablet    Refill:  3    Current medicines are reviewed at length with the patient today.  The patient does not have concerns regarding medicines.  History of Present Illness:   FOCUSED PROBLEM LIST:   Hypertension Anxiety BMI of 40  The patient is a 62 y.o. female with the indicated medical history here for recommendations regarding carotid ultrasound findings.  The patient was seen by an ophthalmologist recently due to transient unilateral visual loss.  There was suspicion of perhaps an embolic phenomenon to the eye.  The patient was referred for carotid Dopplers which were limited by poor imaging.  The patient is referred to cardiology for further recommendations.  The patient tells me that when she saw her ophthalmologist they did not think that she had had an embolism to the eye based on their funduscopic examination.  She has had no signs or symptoms of stroke.  She denies any chest pain.  Her biggest complaint today is ongoing dyspnea.  This has been longstanding.  She finds it  still short of breath with more than moderate exertion.  She is not short of breath at rest.  She occasionally feels some extra heartbeats at night when she lies down but these do not seem bothersome.  She has had no presyncope or syncope.  She has had no severe bleeding.  She does snore at night.  She has required no emergency room visits or hospitalizations.         Current Medications: Current Meds  Medication Sig   ALPRAZolam (XANAX) 0.25 MG tablet Take 0.25 mg by mouth daily as needed for anxiety.   aspirin EC 81 MG tablet Take 81 mg by mouth as needed.   CYANOCOBALAMIN IJ Inject 1,000 mLs as directed  once a week.   Ergocalciferol 50 MCG (2000 UT) CAPS Per patient 5,000 units daily   hydrochlorothiazide (MICROZIDE) 12.5 MG capsule Take 12.5 mg by mouth as needed.   lisinopril (ZESTRIL) 40 MG tablet Take 1 tablet (40 mg total) by mouth daily.   Melatonin 5 MG TBDP Take by mouth as needed.   metoprolol tartrate (LOPRESSOR) 100 MG tablet Take 1 tablet (100 mg total) by mouth once for 1 dose. Take 90-120 minutes prior to scan.   ZITHROMAX 250 MG tablet Take 250 mg by mouth daily.   zolpidem (AMBIEN) 5 MG tablet Take 5 mg by mouth at bedtime as needed for sleep.   [DISCONTINUED] lisinopril (ZESTRIL) 20 MG tablet Take 20 mg by mouth daily.     Allergies:    Amoxicillin, Anectine [succinylcholine chloride], Betalin 12 [vitamin b12], Codeine, Darvocet [propoxyphene n-acetaminophen], Levaquin [levofloxacin], and Clindamycin/lincomycin   Social History:   Social History   Tobacco Use   Smoking status: Never   Smokeless tobacco: Never  Substance Use Topics   Alcohol use: Yes   Drug use: No     Family Hx: History reviewed. No pertinent family history.   Review of Systems:   Please see the history of present illness.    All other systems reviewed and are negative.     EKGs/Labs/Other Test Reviewed:   EKG:    EKG Interpretation Date/Time:  Friday October 09 2022 12:57:36 EDT Ventricular Rate:  73 PR Interval:  156 QRS Duration:  92 QT Interval:  398 QTC Calculation: 438 R Axis:   54  Text Interpretation: Normal sinus rhythm Normal ECG Confirmed by Alverda Skeans ([id number]) on 10/09/2022 1:06:09 PM        Prior CV studies reviewed:     Recent Labs: No results found for requested labs within last 365 days.   Lipid Panel No results found for: "CHOL", "TRIG", "HDL", "CHOLHDL", "VLDL", "LDLCALC", "LDLDIRECT"  Risk Assessment/Calculations:          Physical Exam:   VS:  BP 120/86   Pulse 73   Ht 5' 7.5" (1.715 m)   Wt 259 lb 9.6 oz (117.8 kg)   SpO2 97%   BMI 40.06  kg/m        Wt Readings from Last 3 Encounters:  10/09/22 259 lb 9.6 oz (117.8 kg)  02/06/19 254 lb (115.2 kg)  10/24/18 254 lb (115.2 kg)      GENERAL:  No apparent distress, AOx3 HEENT:  No carotid bruits, +2 carotid impulses, no scleral icterus CAR: RRR no murmurs, gallops, rubs, or thrills RES:  Clear to auscultation bilaterally ABD:  Soft, nontender, nondistended, positive bowel sounds x 4 VASC:  +2 radial pulses, +2 carotid pulses NEURO:  CN 2-12 grossly intact; motor and sensory grossly  intact PSYCH:  No active depression or anxiety EXT:  No edema, ecchymosis, or cyanosis  Signed, Orbie Pyo, MD  10/09/2022 1:47 PM    [facility] [address] [place], [place], [place]  [address] Phone: [phone]; Fax: [phone]   Note:  This document was prepared using Dragon voice recognition software and may include unintentional dictation errors.

## 2022-10-09 ENCOUNTER — Encounter: Payer: Self-pay | Admitting: Internal Medicine

## 2022-10-09 ENCOUNTER — Ambulatory Visit: Payer: 59 | Attending: Internal Medicine | Admitting: Internal Medicine

## 2022-10-09 VITALS — BP 120/86 | HR 73 | Ht 67.5 in | Wt 259.6 lb

## 2022-10-09 DIAGNOSIS — H53121 Transient visual loss, right eye: Secondary | ICD-10-CM

## 2022-10-09 DIAGNOSIS — R0609 Other forms of dyspnea: Secondary | ICD-10-CM | POA: Diagnosis not present

## 2022-10-09 DIAGNOSIS — I10 Essential (primary) hypertension: Secondary | ICD-10-CM | POA: Diagnosis not present

## 2022-10-09 DIAGNOSIS — Z6841 Body Mass Index (BMI) 40.0 and over, adult: Secondary | ICD-10-CM

## 2022-10-09 DIAGNOSIS — R0683 Snoring: Secondary | ICD-10-CM

## 2022-10-09 MED ORDER — METOPROLOL TARTRATE 100 MG PO TABS: 100.0000 mg | ORAL_TABLET | Freq: Once | ORAL | 0 refills | Status: AC

## 2022-10-09 MED ORDER — LISINOPRIL 40 MG PO TABS: 40.0000 mg | ORAL_TABLET | Freq: Every day | ORAL | 3 refills | Status: AC

## 2022-10-09 NOTE — Patient Instructions (Addendum)
Medication Instructions:  Your physician has recommended you make the following change in your medication:  1.) increase lisinopril to 40 mg - one tablet daily  *If you need a refill on your cardiac medications before your next appointment, please call your pharmacy*   Lab Work: none   Testing/Procedures: Your physician has requested that you have an echocardiogram. Echocardiography is a painless test that uses sound waves to create images of your heart. It provides your doctor with information about the size and shape of your heart and how well your heart's chambers and valves are working. This procedure takes approximately one hour. There are no restrictions for this procedure. Please do NOT wear cologne, perfume, aftershave, or lotions (deodorant is allowed). Please arrive 15 minutes prior to your appointment time.  Cardiac CTA - see instructions below   Follow-Up: At [facility], you and your health needs are our priority.  As part of our continuing mission to provide you with exceptional heart care, we have created designated Provider Care Teams.  These Care Teams include your primary Cardiologist (physician) and Advanced Practice Providers (APPs -  Physician Assistants and Nurse Practitioners) who all work together to provide you with the care you need, when you need it.   Your next appointment:   6 month(s)  Provider:   Jari Favre, PA-C, Ronie Spies, PA-C, Robin Searing, NP, Jacolyn Reedy, PA-C, Eligha Bridegroom, NP, Tereso Newcomer, PA-C, or Perlie Gold, PA-C          Your cardiac CT will be scheduled at [facility] [address] [place], [place] [address] [phone]  Please arrive at the [facility] Entr[facility] (Entrance C2) of [facility] 30 minutes prior to test start time. You can use the FREE valet parking offered at entrance C (encouraged to control the heart rate for the test)  Proceed to the [facility] Radiology  Department (first floor) to check-in and test prep.  All radiology patients and guests should use entrance C2 at [facility], accessed from [facility], even though the hospital's physical address listed is [address].     Please follow these instructions carefully (unless otherwise directed):  An IV will be required for this test and Nitroglycerin will be given.   On the Night Before the Test: Be sure to Drink plenty of water. Do not consume any caffeinated/decaffeinated beverages or chocolate 12 hours prior to your test. Do not take any antihistamines 12 hours prior to your test. On the Day of the Test: Drink plenty of water until 1 hour prior to the test. Do not eat any food 1 hour prior to test. You may take your regular medications prior to the test.  Take metoprolol (Lopressor) two hours prior to test. If you take Furosemide/Hydrochlorothiazide/Spironolactone, please HOLD on the morning of the test. FEMALES- please wear underwire-free bra if available, avoid dresses & tight clothing  After the Test: Drink plenty of water. After receiving IV contrast, you may experience a mild flushed feeling. This is normal. On occasion, you may experience a mild rash up to 24 hours after the test. This is not dangerous. If this occurs, you can take Benadryl 25 mg and increase your fluid intake. If you experience trouble breathing, this can be serious. If it is severe call 911 IMMEDIATELY. If it is mild, please call our office. If you take any of these medications: Glipizide/Metformin, Avandament, Glucavance, please do not take 48 hours after completing test  unless otherwise instructed.  We will call to schedule your test 2-4 weeks out understanding that some insurance companies will need an authorization prior to the service being performed.   For more information and frequently asked questions, please visit our website : [email or web address]  For  non-scheduling related questions, please contact the cardiac imaging nurse navigator should you have any questions/concerns: Cardiac Imaging Nurse Navigators Direct Office Dial: [phone]   For scheduling needs, including cancellations and rescheduling, please call [place], [phone].

## 2022-10-12 ENCOUNTER — Encounter (HOSPITAL_COMMUNITY): Payer: Self-pay

## 2022-10-20 ENCOUNTER — Encounter (HOSPITAL_COMMUNITY): Payer: Self-pay

## 2022-10-22 ENCOUNTER — Ambulatory Visit
Admission: RE | Admit: 2022-10-22 | Discharge: 2022-10-22 | Disposition: A | Discharge status: Home / Self Care | Payer: 59 | Source: Ambulatory Visit | Attending: Internal Medicine | Admitting: Internal Medicine

## 2022-10-22 DIAGNOSIS — R0609 Other forms of dyspnea: Secondary | ICD-10-CM | POA: Diagnosis present

## 2022-10-22 MED ORDER — NITROGLYCERIN 0.4 MG SL SUBL
0.8000 mg | SUBLINGUAL_TABLET | Freq: Once | SUBLINGUAL | Status: AC
  Administered 2022-10-22: 0.8 mg via SUBLINGUAL

## 2022-10-22 MED ORDER — SODIUM CHLORIDE 0.9 % IV BOLUS
150.0000 mL | Freq: Once | INTRAVENOUS | Status: AC
  Administered 2022-10-22: 150 mL via INTRAVENOUS

## 2022-10-22 MED ORDER — IOHEXOL 350 MG/ML SOLN
100.0000 mL | Freq: Once | INTRAVENOUS | Status: AC | PRN
  Administered 2022-10-22: 100 mL via INTRAVENOUS

## 2022-10-22 NOTE — Progress Notes (Signed)
Patient tolerated CT well. Vital signs stable encourage to drink water throughout day.Reasons explained and verbalized understanding. Ambulated steady gait.

## 2022-10-27 ENCOUNTER — Telehealth (HOSPITAL_COMMUNITY): Payer: Self-pay | Admitting: Internal Medicine

## 2022-10-27 NOTE — Telephone Encounter (Signed)
Patient called and cancelled echocardiogram 10/27/22 due to normal CT.  Order will be removed from the echo WQ.Marland Kitchen

## 2022-10-28 ENCOUNTER — Ambulatory Visit (HOSPITAL_COMMUNITY): Payer: 59

## 2023-04-02 ENCOUNTER — Telehealth: Payer: Self-pay

## 2023-04-02 NOTE — Telephone Encounter (Signed)
**Note De-Identified Karen Huhta Obfuscation** Split Night Sleep Study PA has been approved from 05/17/2023-08/15/2023 per the [facility] Provider Portal. Auth #: [id number]

## 2023-09-22 ENCOUNTER — Telehealth: Payer: Self-pay | Admitting: Internal Medicine

## 2023-09-22 NOTE — Telephone Encounter (Signed)
 When patient was called to schedule recall, she didn't want to schedule. She said she was looking for a different cardiologist.

## 2023-09-29 ENCOUNTER — Emergency Department (HOSPITAL_COMMUNITY): Admission: EM | Admit: 2023-09-29 | Discharge: 2023-09-29 | Disposition: A | Discharge status: Home / Self Care

## 2023-09-29 ENCOUNTER — Emergency Department (HOSPITAL_COMMUNITY)

## 2023-09-29 ENCOUNTER — Encounter (HOSPITAL_COMMUNITY): Payer: Self-pay

## 2023-09-29 ENCOUNTER — Other Ambulatory Visit: Payer: Self-pay

## 2023-09-29 DIAGNOSIS — Z7982 Long term (current) use of aspirin: Secondary | ICD-10-CM | POA: Insufficient documentation

## 2023-09-29 DIAGNOSIS — M546 Pain in thoracic spine: Secondary | ICD-10-CM | POA: Diagnosis present

## 2023-09-29 LAB — BASIC METABOLIC PANEL WITH GFR
Anion gap: 9 (ref 5–15)
BUN: 7 mg/dL — ABNORMAL LOW (ref 8–23)
CO2: 26 mmol/L (ref 22–32)
Calcium: 9.9 mg/dL (ref 8.9–10.3)
Chloride: 101 mmol/L (ref 98–111)
Creatinine, Ser: 0.6 mg/dL (ref 0.44–1.00)
GFR, Estimated: >60 mL/min (ref >60)
Glucose, Bld: 108 mg/dL — ABNORMAL HIGH (ref 70–99)
Potassium: 5.2 mmol/L — ABNORMAL HIGH (ref 3.5–5.1)
Sodium: 136 mmol/L (ref 135–145)

## 2023-09-29 LAB — TROPONIN I (HIGH SENSITIVITY)
Troponin I (High Sensitivity): 4 ng/L (ref <18)
Troponin I (High Sensitivity): 4 ng/L (ref <18)

## 2023-09-29 LAB — CBC
HCT: 43.7 % (ref 36.0–46.0)
Hemoglobin: 14.6 g/dL (ref 12.0–15.0)
MCH: 30 pg (ref 26.0–34.0)
MCHC: 33.4 g/dL (ref 30.0–36.0)
MCV: 89.9 fL (ref 80.0–100.0)
Platelets: 232 K/uL (ref 150–400)
RBC: 4.86 MIL/uL (ref 3.87–5.11)
RDW: 12.4 % (ref 11.5–15.5)
WBC: 6.7 K/uL (ref 4.0–10.5)
nRBC: 0 % (ref 0.0–0.2)

## 2023-09-29 MED ORDER — IOHEXOL 350 MG/ML SOLN
100.0000 mL | Freq: Once | INTRAVENOUS | Status: AC | PRN
  Administered 2023-09-29: 100 mL via INTRAVENOUS

## 2023-09-29 MED ORDER — KETOROLAC TROMETHAMINE 15 MG/ML IJ SOLN
15.0000 mg | Freq: Once | INTRAMUSCULAR | Status: AC
  Administered 2023-09-29: 15 mg via INTRAVENOUS
  Filled 2023-09-29: qty 1

## 2023-09-29 MED ORDER — LIDOCAINE 5 % EX PTCH
1.0000 | MEDICATED_PATCH | Freq: Once | CUTANEOUS | Status: DC
  Administered 2023-09-29: 1 via TRANSDERMAL
  Filled 2023-09-29: qty 1

## 2023-09-29 MED ORDER — METHOCARBAMOL 500 MG PO TABS
500.0000 mg | ORAL_TABLET | Freq: Once | ORAL | Status: AC
  Administered 2023-09-29: 500 mg via ORAL
  Filled 2023-09-29: qty 1

## 2023-09-29 MED ORDER — METHOCARBAMOL 500 MG PO TABS: 500.0000 mg | ORAL_TABLET | Freq: Two times a day (BID) | ORAL | 0 refills | Status: AC | PRN

## 2023-09-29 MED ORDER — ACETAMINOPHEN 500 MG PO TABS
1000.0000 mg | ORAL_TABLET | Freq: Once | ORAL | Status: AC
  Administered 2023-09-29: 1000 mg via ORAL
  Filled 2023-09-29: qty 2

## 2023-09-29 MED ORDER — LIDOCAINE 5 % EX PTCH: 1.0000 | MEDICATED_PATCH | CUTANEOUS | 0 refills | Status: AC

## 2023-09-29 NOTE — ED Triage Notes (Signed)
 Pt arrives POV with complaints of left arm pain that starts in her shoulder blade and radiates down her arm x 20 days. With laying down worsening the pain. Went to dermatologist today and was sent here due to having hypertension and no sign of shingles.

## 2023-09-29 NOTE — Discharge Instructions (Addendum)
 Please follow-up with your primary doctor.  Return if develop any new or worsening symptoms.

## 2023-09-29 NOTE — ED Provider Notes (Signed)
 [place] EMERGENCY DEPARTMENT AT [facility] Provider Note   CSN: [id number] Arrival date & time: 09/29/23  1137     Patient presents with: No chief complaint on file.   Patricia Robbins is a 63 y.o. female.   This is a 63 year old female presenting emergency department with left upper back pain radiating down her left arm.  Symptoms have been present for the past 20 days, seemingly worsening over the past week or so.  No chest pain, shortness of breath.  No weakness in arm.  No numbness tingling or changes in sensation.  Thought she had shingles, but went to dermatology today who stated it was not shingles and sent her here.        Prior to Admission medications   Medication Sig Start Date End Date Taking? Authorizing Provider  ALPRAZolam (XANAX) 0.25 MG tablet Take 0.25 mg by mouth daily as needed for anxiety.    [provider]  aspirin EC 81 MG tablet Take 81 mg by mouth as needed. 09/16/18   [provider]  CYANOCOBALAMIN IJ Inject 1,000 mLs as directed once a week.    [provider]  Ergocalciferol 50 MCG (2000 UT) CAPS Per patient 5,000 units daily    [provider]  hydrochlorothiazide (MICROZIDE) 12.5 MG capsule Take 12.5 mg by mouth as needed.    [provider]  lisinopril  (ZESTRIL ) 40 MG tablet Take 1 tablet (40 mg total) by mouth daily. 10/09/22 01/07/23  Thukkani, Arun K, MD  Melatonin 5 MG TBDP Take by mouth as needed.    [provider]  metoprolol  tartrate (LOPRESSOR ) 100 MG tablet Take 1 tablet (100 mg total) by mouth once for 1 dose. Take 90-120 minutes prior to scan. 10/09/22 10/09/22  Thukkani, Arun K, MD  ZITHROMAX 250 MG tablet Take 250 mg by mouth daily. 09/13/22   [provider]  zolpidem (AMBIEN) 5 MG tablet Take 5 mg by mouth at bedtime as needed for sleep.    [provider]    Allergies: Amoxicillin, Anectine [succinylcholine chloride], Betalin 12 [vitamin b12], Codeine,  Darvocet [propoxyphene n-acetaminophen ], Levaquin [levofloxacin], and Clindamycin/lincomycin    Review of Systems  Updated Vital Signs BP (!) 174/98   Pulse 78   Temp (!) 97.5 F (36.4 C) (Oral)   Resp 20   Ht 5' 8 (1.727 m)   Wt 117.9 kg   SpO2 100%   BMI 39.53 kg/m   Physical Exam Vitals and nursing note reviewed.  Constitutional:      General: She is not in acute distress.    Appearance: She is not toxic-appearing.  HENT:     Head: Normocephalic.     Nose: Nose normal.     Mouth/Throat:     Mouth: Mucous membranes are moist.  Eyes:     Conjunctiva/sclera: Conjunctivae normal.  Cardiovascular:     Rate and Rhythm: Normal rate.     Pulses: Normal pulses.  Pulmonary:     Effort: Pulmonary effort is normal.  Abdominal:     General: Abdomen is flat. There is no distension.     Tenderness: There is no abdominal tenderness. There is no guarding or rebound.  Musculoskeletal:     Right lower leg: No edema.     Left lower leg: No edema.     Comments: Tenderness to the trapezius muscle the infrascapular border.  5 out of 5 bicep strength tricep strength.  Full pain-free passive range of motion.  Neurovascular intact  in hand.  Equal pulses bilaterally.  Skin:    General: Skin is warm.  Neurological:     Mental Status: She is alert and oriented to person, place, and time.  Psychiatric:        Mood and Affect: Mood normal.        Behavior: Behavior normal.     (all labs ordered are listed, but only abnormal results are displayed) Labs Reviewed  BASIC METABOLIC PANEL WITH GFR - Abnormal; Notable for the following components:      Result Value   Potassium 5.2 (*)    Glucose, Bld 108 (*)    BUN 7 (*)    All other components within normal limits  CBC  TROPONIN I (HIGH SENSITIVITY)  TROPONIN I (HIGH SENSITIVITY)    EKG: EKG Interpretation Date/Time:  Wednesday September 29 2023 11:58:36 EDT Ventricular Rate:  79 PR Interval:  148 QRS Duration:  88 QT  Interval:  384 QTC Calculation: 440 R Axis:   65  Text Interpretation: Normal sinus rhythm Cannot rule out Anterior infarct , age undetermined Abnormal ECG When compared with ECG of 09-Oct-2022 12:57, PREVIOUS ECG IS PRESENT Confirmed by Neysa Clap [id number]) on 09/29/2023 1:40:21 PM  Radiology: DG Chest 2 View Result Date: 09/29/2023 EXAM: 2 VIEW(S) XRAY OF THE CHEST 09/29/2023 12:12:00 PM COMPARISON: 09/02/2012 CLINICAL HISTORY: Cardiac. Per triage notes: Pt arrives POV with complaints of left arm pain that starts in her shoulder blade and radiates down her arm x 20 days. With laying down worsening the pain. Went to dermatologist today and was sent here due to having hypertension and no sign of shingles. Pt also c/o SOB and weakness when the pain occurs. Hx of HTN, asthma; Nonsmoker. FINDINGS: LUNGS AND PLEURA: No focal pulmonary opacity. No pulmonary edema. No pleural effusion. No pneumothorax. HEART AND MEDIASTINUM: No acute abnormality of the cardiac and mediastinal silhouettes. BONES AND SOFT TISSUES: No acute osseous abnormality. Thoracic degenerative changes. IMPRESSION: 1. No acute cardiopulmonary pathology. Electronically signed by: Waddell Calk MD 09/29/2023 12:44 PM EDT RP Workstation: [id number]     Procedures   Medications Ordered in the ED  lidocaine  (LIDODERM ) 5 % 1 patch (1 patch Transdermal Patch Applied 09/29/23 1441)  acetaminophen  (TYLENOL ) tablet 1,000 mg (1,000 mg Oral Given 09/29/23 1441)  methocarbamol  (ROBAXIN ) tablet 500 mg (500 mg Oral Given 09/29/23 1441)  iohexol  (OMNIPAQUE ) 350 MG/ML injection 100 mL (100 mLs Intravenous Contrast Given 09/29/23 1503)                                    Medical Decision Making This is a 63 year old female presenting emergency department for left upper back pain with radiation down her arm.  No history of back pain.  She is afebrile nontachycardic, is hypertensive 174/98.  Workup initiated in triage reassuring with troponin negative  and EKG without ischemic changes.  Basic metabolic panel with mildly elevated potassium, but no AKI or renal changes.  CBC without leukocytosis to suggest infectious process.  No anemia.  She has complex past medical history to include hypertension, asthma, hepatitis and obesity.  Given her new onset thoracic back pain with elevated blood pressure will get CTA to rule out dissection.  However equal pulses, chest x-ray with no widened mediastinum abdomen overall lower clinical index of suspicion.  Her pain is likely secondary to MSK etiology.  Will treat with lidocaine , Tylenol  and muscle relaxer.  There is  no swelling to the arm and low risk Wells.  Low suspicion for DVT.  Care signed out to afternoon team.  Disposition pending CTA.    Amount and/or Complexity of Data Reviewed Independent Historian:     Details: Family member notes symptoms started while she was in [place] External Data Reviewed:     Details: Had a CT coronary June 2024 with a calcium score of 0 Labs: ordered. Decision-making details documented in ED Course. Radiology: ordered and independent interpretation performed.    Details: X-ray without pneumonia pneumothorax ECG/medicine tests:     Details: No STEMI  Risk OTC drugs. Prescription drug management. Decision regarding hospitalization. Diagnosis or treatment significantly limited by social determinants of health. Risk Details: Poor health literacy       Final diagnoses:  None    ED Discharge Orders     None          Neysa Caron PARAS, DO 09/29/23 1524

## 2023-09-29 NOTE — ED Notes (Signed)
 Transported to xray

## 2023-10-07 ENCOUNTER — Other Ambulatory Visit: Payer: Self-pay

## 2023-10-07 ENCOUNTER — Ambulatory Visit (INDEPENDENT_AMBULATORY_CARE_PROVIDER_SITE_OTHER): Admitting: Physician Assistant

## 2023-10-07 ENCOUNTER — Encounter: Payer: Self-pay | Admitting: Physician Assistant

## 2023-10-07 DIAGNOSIS — M25512 Pain in left shoulder: Secondary | ICD-10-CM

## 2023-10-07 MED ORDER — TRAMADOL HCL 50 MG PO TABS
50.0000 mg | ORAL_TABLET | Freq: Four times a day (QID) | ORAL | 0 refills | Status: AC | PRN
Start: 2023-10-07 — End: ?

## 2023-10-07 MED ORDER — METHOCARBAMOL 500 MG PO TABS
500.0000 mg | ORAL_TABLET | Freq: Four times a day (QID) | ORAL | 1 refills | Status: AC | PRN
Start: 2023-10-07 — End: ?

## 2023-10-07 MED ORDER — LIDOCAINE HCL 1 % IJ SOLN
5.0000 mL | INTRAMUSCULAR | Status: AC | PRN
Start: 2023-10-07 — End: 2023-10-07
  Administered 2023-10-07: 5 mL

## 2023-10-07 MED ORDER — METHYLPREDNISOLONE ACETATE 40 MG/ML IJ SUSP
40.0000 mg | INTRAMUSCULAR | Status: AC | PRN
Start: 2023-10-07 — End: 2023-10-07
  Administered 2023-10-07: 40 mg via INTRA_ARTICULAR

## 2023-10-07 NOTE — Progress Notes (Signed)
 Office Visit Note   Patient: Patricia Robbins           Date of Birth: 1960-02-13           MRN: [id number] Visit Date: 10/07/2023              Requested by: Domenick Loma, NP [address] [place],  [place] [address] PCP: Pridgen, Taylar, NP  Chief Complaint  Patient presents with   Left Shoulder - Pain      HPI: She was walking is [place] thinks maybe her purse was to heavy as a guess, but she denies trauma or injury.  She states she did fall years ago and had shoulder pain, but it improved with a steroid injection.    She also has a history of shingles and suspected maybe early shingle out break.  The urgent care she went to gave her treatment for shingles and pain medication oxycodone which cause a sever HA and was stopped.  She was then given a steroid dose pack that cause a blood vessel to bursted in her eye.  She was told to stop the steroids orally.  She was then seen at a dermatologist office and told it was not shingles.  She was found to be hypertensive and sent to the ED.    Her left shoulder pain has gotten worse and she is unable to sleep.    Assessment & Plan: Visit Diagnoses:  1. Left shoulder pain, unspecified chronicity     Plan: I refilled her Robaxin  and gave her tramadol  for pain since she did not tolerate the percocet she was given.    Follow-Up Instructions: No follow-ups on file.   Ortho Exam  Patient is alert, oriented, no adenopathy, well-dressed, normal affect, normal respiratory effort. She has tenderness to palpation over the shoulder girdle.  Non tender over the [facility] joint or scapular spine.  Passive ROM external rotation and abduction are not painful, internal rotation and body cross over increase pain.  No tenderness over the biceps tendon.      Imaging: X ray of the left shoulder The humeral head is centered within the glenoid fossa, and the alignment of the distal clavicle with the acromion is preserved, with no evidence of fractures or  dislocations.  Labs: No results found for: HGBA1C, ESRSEDRATE, CRP, LABURIC, REPTSTATUS, GRAMSTAIN, CULT, LABORGA   Lab Results  Component Value Date   ALBUMIN 4.1 09/02/2012    No results found for: MG No results found for: VD25OH  No results found for: PREALBUMIN    Latest Ref Rng & Units 09/29/2023   11:51 AM 09/02/2012    8:28 PM  CBC EXTENDED  WBC 4.0 - 10.5 K/uL 6.7  7.0   RBC 3.87 - 5.11 MIL/uL 4.86  4.65   Hemoglobin 12.0 - 15.0 g/dL 85.3  86.8   HCT 63.9 - 46.0 % 43.7  39.2   Platelets 150 - 400 K/uL 232  246   NEUT# 1.7 - 7.7 K/uL  4.8   Lymph# 0.7 - 4.0 K/uL  1.8      There is no height or weight on file to calculate BMI.  Orders:  Orders Placed This Encounter  Procedures   XR Shoulder Left   No orders of the defined types were placed in this encounter.    Procedures: Large Joint Inj: L glenohumeral on 10/07/2023 2:11 PM Indications: diagnostic evaluation and pain Details: 22 G 1.5 in needle, posterior approach  Arthrogram: No  Medications:  5 mL lidocaine  1 %; 40 mg methylPREDNISolone  acetate 40 MG/ML Outcome: tolerated well, no immediate complications Procedure, treatment alternatives, risks and benefits explained, specific risks discussed. Consent was given by the patient. Immediately prior to procedure a time out was called to verify the correct patient, procedure, equipment, support staff and site/side marked as required. Patient was prepped and draped in the usual sterile fashion.      Clinical Data: No additional findings.  ROS:  All other systems negative, except as noted in the HPI. Review of Systems  Objective: Vital Signs: There were no vitals taken for this visit.  Specialty Comments:  No specialty comments available.  PMFS History: There are no active problems to display for this patient.  Past Medical History:  Diagnosis Date   Anxiety    Asthma    mild   BMI 40.0-44.9, adult (HCC)    Excessive  daytime sleepiness    Family history of adverse reaction to anesthesia    mother had anaphylaxis to succinylcholine   Hepatitis 1969   Hep A   Hypertension    Hypertensive retinopathy, bilateral    Insomnia    Low back pain    Nontoxic multinodular goiter    Nontoxic uninodular goiter    Obesity    OSA (obstructive sleep apnea)    PTSD (post-traumatic stress disorder)    Renal stone    Right knee pain    Vitamin B 12 deficiency    Vitamin D deficiency     No family history on file.  Past Surgical History:  Procedure Laterality Date   CESAREAN SECTION     CESAREAN SECTION     CHOLECYSTECTOMY     TONSILLECTOMY     Social History   Occupational History   Not on file  Tobacco Use   Smoking status: Never   Smokeless tobacco: Never  Substance and Sexual Activity   Alcohol use: Yes   Drug use: No   Sexual activity: Yes    Comment: husband has had vasectomy

## 2023-10-19 ENCOUNTER — Encounter: Payer: Self-pay | Admitting: Orthopedic Surgery

## 2023-10-19 ENCOUNTER — Other Ambulatory Visit (INDEPENDENT_AMBULATORY_CARE_PROVIDER_SITE_OTHER)

## 2023-10-19 ENCOUNTER — Ambulatory Visit (INDEPENDENT_AMBULATORY_CARE_PROVIDER_SITE_OTHER): Admitting: Orthopedic Surgery

## 2023-10-19 DIAGNOSIS — M542 Cervicalgia: Secondary | ICD-10-CM

## 2023-10-19 DIAGNOSIS — M5412 Radiculopathy, cervical region: Secondary | ICD-10-CM

## 2023-10-19 MED ORDER — PREDNISONE 10 MG PO TABS: 10.0000 mg | ORAL_TABLET | Freq: Every day | ORAL | 0 refills | Status: AC

## 2023-10-19 NOTE — Progress Notes (Signed)
 Office Visit Note   Patient: Patricia Robbins           Date of Birth: 1960/06/15           MRN: [id number] Visit Date: 10/19/2023              Requested by: Domenick Loma, NP [address] [place],  [place] [address] PCP: Pridgen, Taylar, NP  Chief Complaint  Patient presents with   Left Shoulder - Pain      HPI: Discussed the use of AI scribe software for clinical note transcription with the patient, who gave verbal consent to proceed.  History of Present Illness Patricia Robbins is a 63 year old female who presents with shoulder pain for follow-up.  She experiences shoulder pain primarily along the medial scapular border inferiorly, describing it as feeling like 'knots' in that location. The pain has been present for an unspecified duration and is now radiating down her arm. She experiences sharp pain that wakes her up around 1:00 AM.  She has been experiencing numbness in the index finger of her left hand for about ten days. She has tried anti-inflammatories and received a subacromial injection, neither of which provided relief.  She mentions a history of being given Toradol  at the ER on August 14th, which she was unsure if it was a steroid shot. She also received prednisone  in the past, which caused a 'tan bruise' and a 'really bad headache' in her left eye due to her glaucoma. She has taken prednisone  in a different dosing regimen previously without issues.  She was given oxycodone at urgent care, which resulted in a 'horrific headache' and did not alleviate her pain. She also tried tramadol , which did not relieve her pain but made her sleepy. She is currently not taking any pain medication.  She has been prescribed Robaxin , which she can take up to three times a day, although she was initially instructed to take it once a day. She has used heat on her shoulder and arm but not on her neck, and has also tried cold packs.  Her symptoms began suddenly on August 8th, with  pain preventing her from sleeping until around 3:00 AM. She also developed blisters, which were evaluated by her dermatologist and an ER doctor, neither of whom believed it was shingles. 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     Assessment & Plan: Visit Diagnoses:  1. Neck pain   2. Cervical radiculopathy, acute     Plan: Assessment and Plan Assessment & Plan Cervical spondylosis with radiculopathy Progressive cervical spondylosis with radiculopathy, with radiographs showing collapse and osteophytes at C4-C6, loss of lordosis, and anterior narrowing. Previous subacromial injection ineffective. Lower dose prednisone  considered appropriate due to adverse reactions to higher doses. - Prescribe prednisone  10 mg daily with breakfast. - Advise Robaxin  up to three times daily for muscle spasms. - Recommend heat application to neck for muscle spasms. - Plan MRI of neck if no improvement in 6-8 weeks. - Schedule follow-up in two weeks.      Follow-Up Instructions: No follow-ups on file.   Ortho Exam  Patient is alert, oriented, no adenopathy, well-dressed, normal affect, normal respiratory effort. Physical Exam     Examination patient has no focal motor weakness in either upper extremity she has numbness in the index finger left hand.  The biceps tendon is not tender to palpation pain is reproduced with palpation over the medial border of the scapula.  Thoracic outlet  is not tender to palpation.   Imaging: No results found. No images are attached to the encounter.  Labs: No results found for: HGBA1C, ESRSEDRATE, CRP, LABURIC, REPTSTATUS, GRAMSTAIN, CULT, LABORGA   Lab Results  Component Value Date   ALBUMIN 4.1 09/02/2012    No results found for: MG No results found for: VD25OH  No results found for: PREALBUMIN    Latest Ref Rng & Units 09/29/2023   11:51 AM 09/02/2012    8:28 PM  CBC EXTENDED  WBC 4.0 - 10.5 K/uL 6.7  7.0   RBC  3.87 - 5.11 MIL/uL 4.86  4.65   Hemoglobin 12.0 - 15.0 g/dL 85.3  86.8   HCT 63.9 - 46.0 % 43.7  39.2   Platelets 150 - 400 K/uL 232  246   NEUT# 1.7 - 7.7 K/uL  4.8   Lymph# 0.7 - 4.0 K/uL  1.8      There is no height or weight on file to calculate BMI.  Orders:  Orders Placed This Encounter  Procedures   XR Cervical Spine 2 or 3 views   Meds ordered this encounter  Medications   predniSONE  (DELTASONE ) 10 MG tablet    Sig: Take 1 tablet (10 mg total) by mouth daily with breakfast.    Dispense:  30 tablet    Refill:  0     Procedures: No procedures performed  Clinical Data: No additional findings.  ROS:  All other systems negative, except as noted in the HPI. Review of Systems  Objective: Vital Signs: There were no vitals taken for this visit.  Specialty Comments:  No specialty comments available.  PMFS History: There are no active problems to display for this patient.  Past Medical History:  Diagnosis Date   Anxiety    Asthma    mild   BMI 40.0-44.9, adult (HCC)    Excessive daytime sleepiness    Family history of adverse reaction to anesthesia    mother had anaphylaxis to succinylcholine   Hepatitis 1969   Hep A   Hypertension    Hypertensive retinopathy, bilateral    Insomnia    Low back pain    Nontoxic multinodular goiter    Nontoxic uninodular goiter    Obesity    OSA (obstructive sleep apnea)    PTSD (post-traumatic stress disorder)    Renal stone    Right knee pain    Vitamin B 12 deficiency    Vitamin D deficiency     History reviewed. No pertinent family history.  Past Surgical History:  Procedure Laterality Date   CESAREAN SECTION     CESAREAN SECTION     CHOLECYSTECTOMY     TONSILLECTOMY     Social History   Occupational History   Not on file  Tobacco Use   Smoking status: Never   Smokeless tobacco: Never  Substance and Sexual Activity   Alcohol use: Yes   Drug use: No   Sexual activity: Yes    Comment: husband has  had vasectomy

## 2023-10-21 ENCOUNTER — Ambulatory Visit: Admitting: Physician Assistant

## 2023-10-21 IMAGING — MR MR KNEE*L* W/O CM
4 of 6 series · 24 of 40 positions shown · non-contrast
Comparison: None.

CLINICAL DATA: Meniscal injury, knee

EXAM:
MRI OF THE LEFT KNEE WITHOUT CONTRAST
TECHNIQUE: Multiplanar, multisequence MR imaging of the knee was performed. No
intravenous contrast was administered.

[Series 4: T2 fat-sat · coronal · 4.0mm · 0.62mm/px · 7 of 27 slices shown (1 of 2)]
[im 1/27]
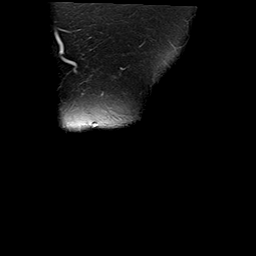
[im 5/27]
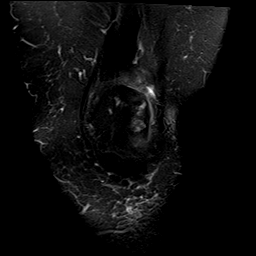
[im 9/27]
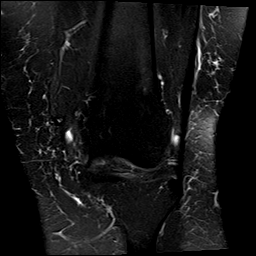
[im 14/27]
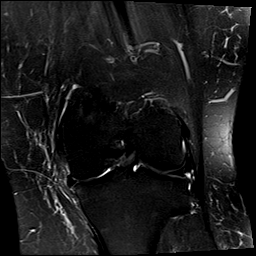
[im 18/27]
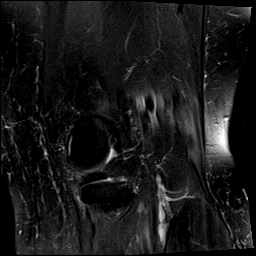
[im 22/27]
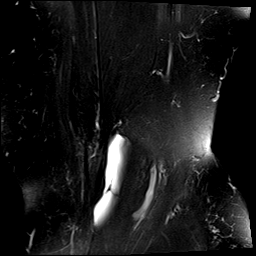
[im 27/27]
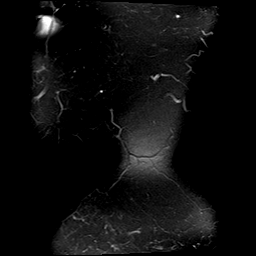

[Series 5: T1 · coronal · 4.0mm · 0.31mm/px · 3 of 27 slices shown]
[im 6/27]
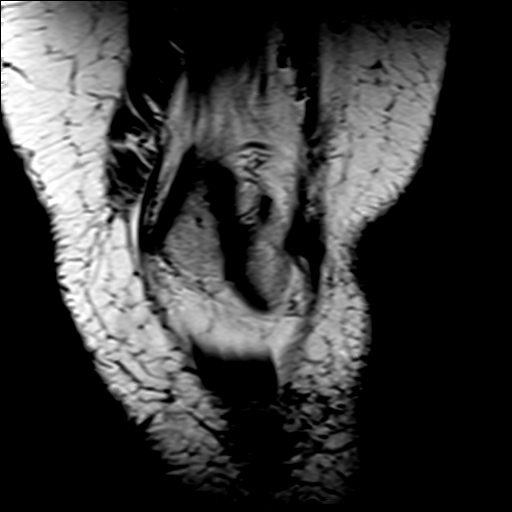
[im 16/27]
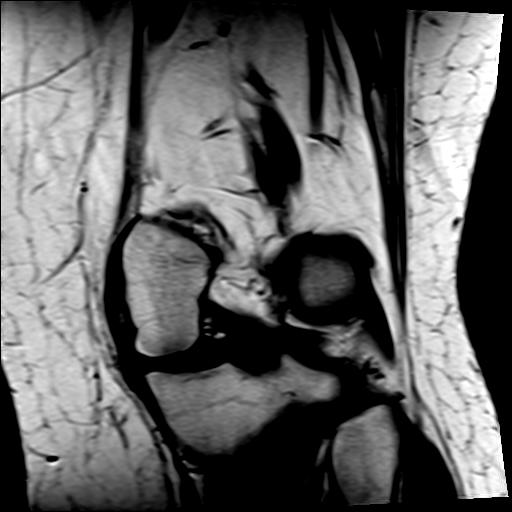
[im 27/27]
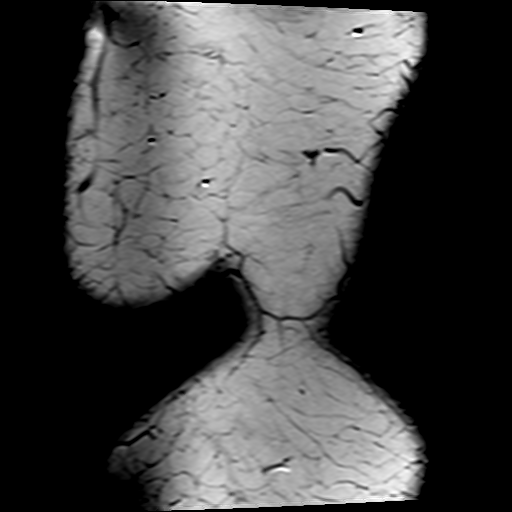

[Series 7: PD fat-sat · sagittal · 3.0mm · 0.31mm/px · 7 of 32 slices shown]
[im 1/32]
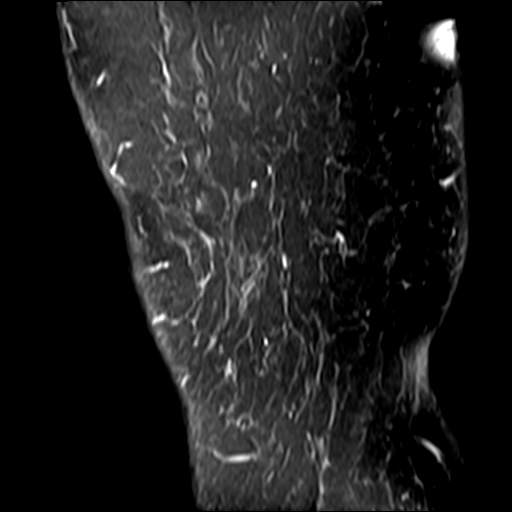
[im 6/32]
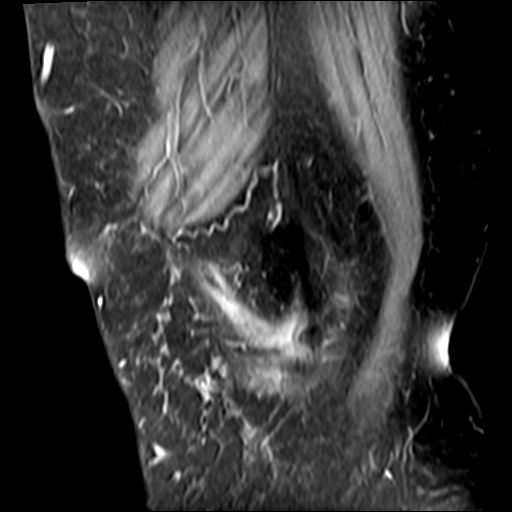
[im 11/32]
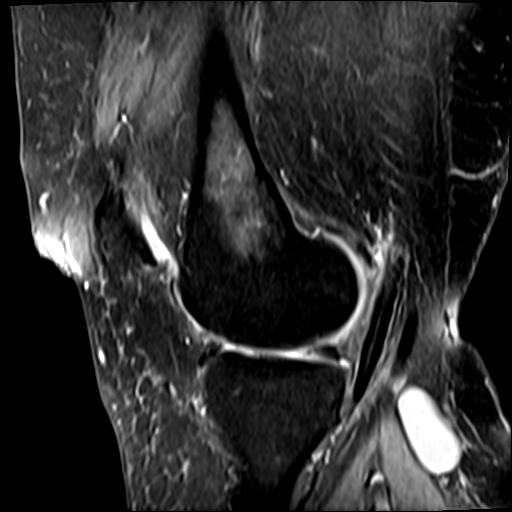
[im 16/32]
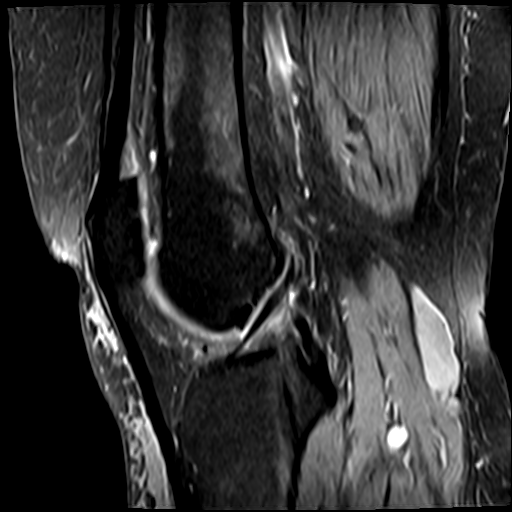
[im 21/32]
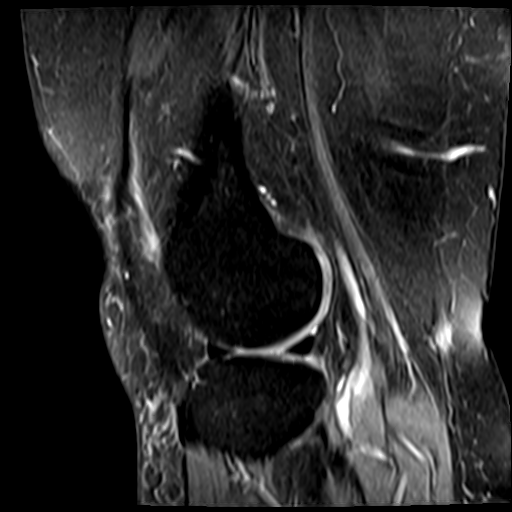
[im 26/32]
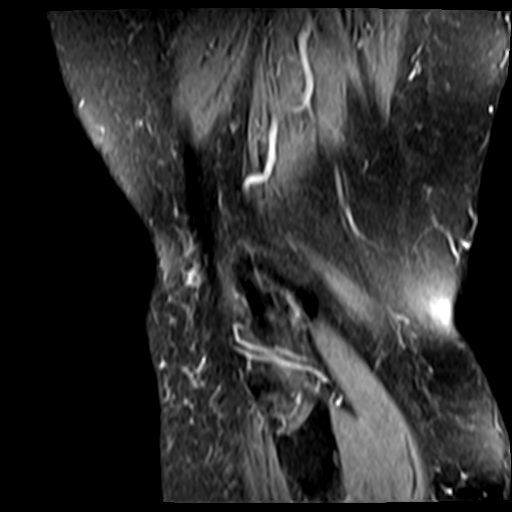
[im 32/32]
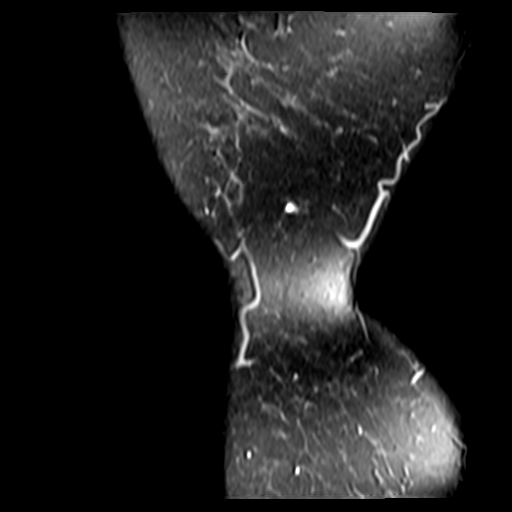

[Series 8: T2 fat-sat · sagittal · 3.0mm · 0.31mm/px · 7 of 32 slices shown (2 of 2)]
[im 1/32]
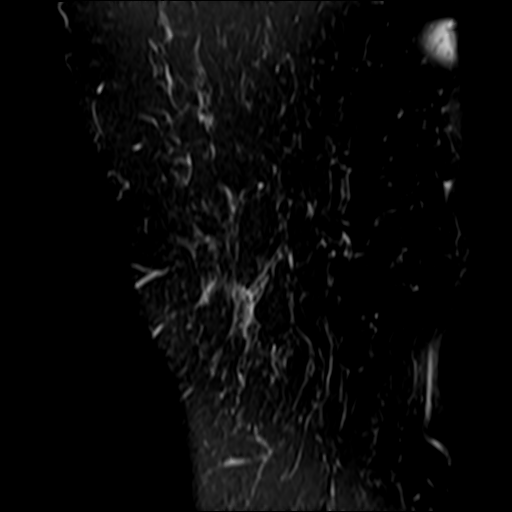
[im 6/32]
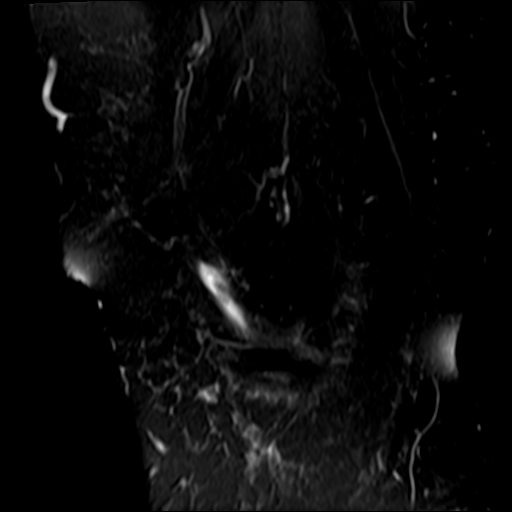
[im 11/32]
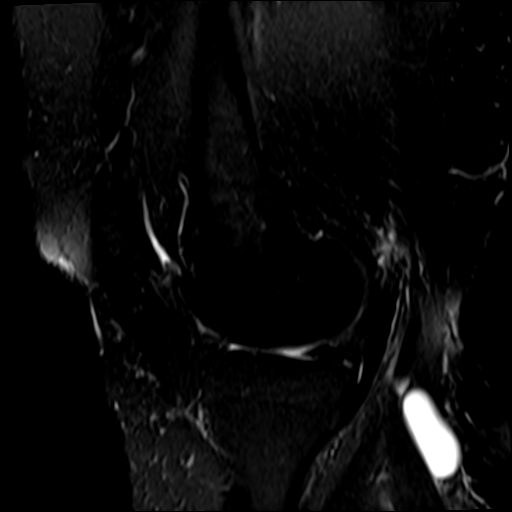
[im 16/32]
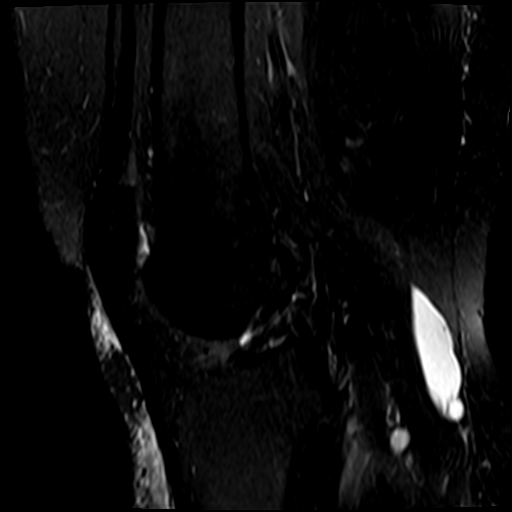
[im 21/32]
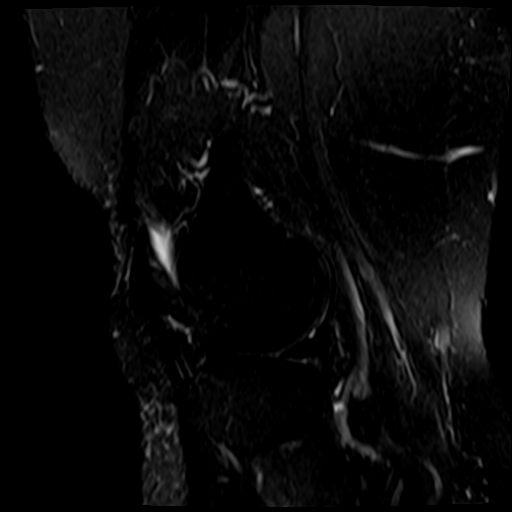
[im 26/32]
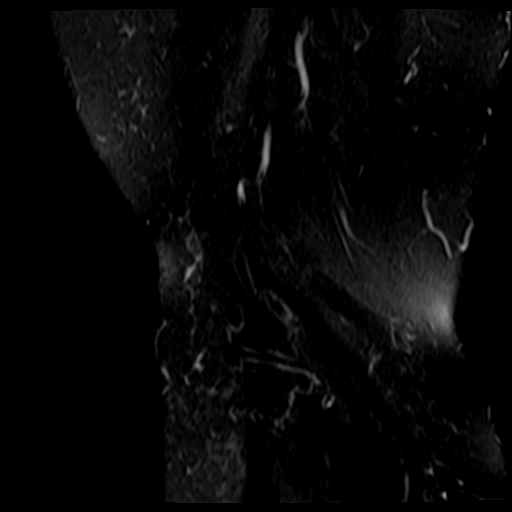
[im 32/32]
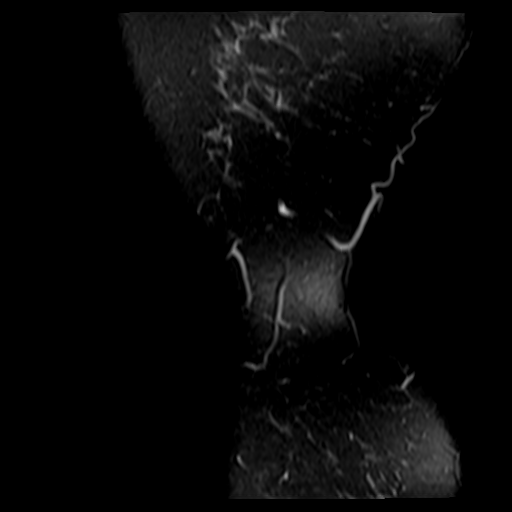

[24 of 40 positions shown; findings below may reference images not displayed]

FINDINGS: MENISCI

Medial: There is nondisplaced degenerative tearing of the posterior
horn of the medial meniscus with possible horizontal extension into
the meniscal body.

Lateral: Intact.

LIGAMENTS

Cruciates: ACL and PCL are intact.

Collaterals: Medial collateral ligament is intact. Lateral
collateral ligament complex is intact.

CARTILAGE

Patellofemoral: Intermediate to high-grade cartilage loss and
fissuring along the lateral patellar facet and median patellar
ridge.

Medial: Intermediate grade partial-thickness cartilage loss along
the weight-bearing surfaces.

Lateral:  Mild chondrosis.

JOINT: Trace joint effusion.

POPLITEAL FOSSA: Moderate size Baker cyst.

EXTENSOR MECHANISM: Intact quadriceps tendon. Intact patellar
tendon.

BONES: Tricompartment osteophyte formation. No evidence of acute
fracture. No aggressive bone lesion.

Other: No additional findings.
IMPRESSION: Nondisplaced degenerative tearing of the posterior horn of the
medial meniscus with possible horizontal extension into the meniscal
body.

Tricompartment osteoarthritis, worst in the medial and
patellofemoral compartments, cartilaginous abnormalities as
described above.

Trace joint effusion.  Moderate size Baker cyst.

## 2023-10-26 ENCOUNTER — Telehealth: Payer: Self-pay | Admitting: Orthopedic Surgery

## 2023-10-26 NOTE — Telephone Encounter (Signed)
 Patient called and said she need a copy of her xrays. CB#[phone]

## 2023-10-26 NOTE — Telephone Encounter (Signed)
 Patient aware CD is ready for pick up at front desk

## 2023-10-26 NOTE — Telephone Encounter (Signed)
 Could you please burn a disc for the pt of her c spine and shoulder xrays please and let her know?

## 2023-11-02 ENCOUNTER — Ambulatory Visit (INDEPENDENT_AMBULATORY_CARE_PROVIDER_SITE_OTHER): Admitting: Orthopedic Surgery

## 2023-11-02 ENCOUNTER — Encounter: Payer: Self-pay | Admitting: Orthopedic Surgery

## 2023-11-02 DIAGNOSIS — M5412 Radiculopathy, cervical region: Secondary | ICD-10-CM

## 2023-11-02 NOTE — Progress Notes (Signed)
 H  Office Visit Note   Patient: Patricia Robbins           Date of Birth: 1960/09/29           MRN: [id number] Visit Date: 11/02/2023              Requested by: Domenick Loma, NP [address] [place],  [place] [address] PCP: Pridgen, Taylar, NP  Chief Complaint  Patient presents with   Neck - Pain      HPI: Discussed the use of AI scribe software for clinical note transcription with the patient, who gave verbal consent to proceed.  History of Present Illness Patricia Robbins is a 63 year old female with degenerative disc disease who presents with left shoulder and arm pain with numbness and tingling.  She has been experiencing left shoulder and arm pain that began last weekend after she stretched and felt a pop under her shoulder blade, which relieved half of the pain, but numbness and tingling persisted down her arm to her fingers. On Sunday night, another pop occurred under the shoulder blade when she turned wrong while lying down.  She visited a chiropractor on Monday and Tuesday, which provided some relief, particularly in her elbow, but the symptoms returned later. Turning her head exacerbates the pain under the shoulder blade, and she experiences a grinding sensation in her neck.  Numbness and tingling extend down into her thumb, index, and long finger, with the index finger being permanently numb. She discontinued prednisone  due to a 'weird headache' that she describes as distinct from other types of headaches.  She is currently taking Prozac and Lamictal for depression and PTSD. She mentions a previous episode of shingles, confirmed by a dermatologist, which she initially thought was related to her current symptoms. However, the shingles were on the right side, while her current pain is on the left.  The pain has significantly impacted her daily activities, making tasks like driving, doing laundry, and holding a cell phone difficult due to the pain and numbness. No pain in  the front or back of the neck.     Assessment & Plan: Visit Diagnoses:  1. Cervical radiculopathy, acute     Plan: Assessment and Plan Assessment & Plan Cervical radiculopathy due to cervical degenerative disc disease Cervical radiculopathy with numbness and tingling in thumb, index, and long finger, permanent numbness in index finger. Pain under medial scapula, worsened by neck movements. Radiographs show degenerative disc disease at C4, C5, and C6. Symptoms improved since August but still significant. - Order MRI of cervical spine to assess nerve impingement and guide potential injection therapy. - Coordinate MRI scheduling at facility [address] in [facility]. - Advise her to contact office post-MRI to discuss results and further management, including potential injection therapy.      Follow-Up Instructions: Return if symptoms worsen or fail to improve.   Ortho Exam  Patient is alert, oriented, no adenopathy, well-dressed, normal affect, normal respiratory effort. Physical Exam NECK: No thoracic outlet tenderness. Tenderness over medial scapula.   Patient has been symptomatic since October 8 of last year with multiple chiropractic manipulations.  Patient has radicular pain to the index long and thumb.  She has complete numbness of the index finger left hand. Imaging: No results found. No images are attached to the encounter.  Labs: No results found for: HGBA1C, ESRSEDRATE, CRP, LABURIC, REPTSTATUS, GRAMSTAIN, CULT, LABORGA   Lab Results  Component Value Date   ALBUMIN 4.1  09/02/2012    No results found for: MG No results found for: VD25OH  No results found for: PREALBUMIN    Latest Ref Rng & Units 09/29/2023   11:51 AM 09/02/2012    8:28 PM  CBC EXTENDED  WBC 4.0 - 10.5 K/uL 6.7  7.0   RBC 3.87 - 5.11 MIL/uL 4.86  4.65   Hemoglobin 12.0 - 15.0 g/dL 85.3  86.8   HCT 63.9 - 46.0 % 43.7  39.2   Platelets 150 - 400 K/uL 232  246   NEUT#  1.7 - 7.7 K/uL  4.8   Lymph# 0.7 - 4.0 K/uL  1.8      There is no height or weight on file to calculate BMI.  Orders:  Orders Placed This Encounter  Procedures   MR Cervical Spine w/o contrast   No orders of the defined types were placed in this encounter.    Procedures: No procedures performed  Clinical Data: No additional findings.  ROS:  All other systems negative, except as noted in the HPI. Review of Systems  Objective: Vital Signs: There were no vitals taken for this visit.  Specialty Comments:  No specialty comments available.  PMFS History: There are no active problems to display for this patient.  Past Medical History:  Diagnosis Date   Anxiety    Asthma    mild   BMI 40.0-44.9, adult (HCC)    Excessive daytime sleepiness    Family history of adverse reaction to anesthesia    mother had anaphylaxis to succinylcholine   Hepatitis 1969   Hep A   Hypertension    Hypertensive retinopathy, bilateral    Insomnia    Low back pain    Nontoxic multinodular goiter    Nontoxic uninodular goiter    Obesity    OSA (obstructive sleep apnea)    PTSD (post-traumatic stress disorder)    Renal stone    Right knee pain    Vitamin B 12 deficiency    Vitamin D deficiency     History reviewed. No pertinent family history.  Past Surgical History:  Procedure Laterality Date   CESAREAN SECTION     CESAREAN SECTION     CHOLECYSTECTOMY     TONSILLECTOMY     Social History   Occupational History   Not on file  Tobacco Use   Smoking status: Never   Smokeless tobacco: Never  Substance and Sexual Activity   Alcohol use: Yes   Drug use: No   Sexual activity: Yes    Comment: husband has had vasectomy

## 2023-11-08 ENCOUNTER — Ambulatory Visit
Admission: RE | Admit: 2023-11-08 | Discharge: 2023-11-08 | Disposition: A | Discharge status: Home / Self Care | Source: Ambulatory Visit | Attending: Orthopedic Surgery | Admitting: Orthopedic Surgery

## 2023-11-08 DIAGNOSIS — M5412 Radiculopathy, cervical region: Secondary | ICD-10-CM

## 2023-11-16 ENCOUNTER — Ambulatory Visit: Payer: Self-pay | Admitting: Orthopedic Surgery

## 2023-11-16 DIAGNOSIS — M5412 Radiculopathy, cervical region: Secondary | ICD-10-CM | POA: Diagnosis not present

## 2023-11-16 DIAGNOSIS — M542 Cervicalgia: Secondary | ICD-10-CM

## 2023-11-17 ENCOUNTER — Encounter: Payer: Self-pay | Admitting: Orthopedic Surgery

## 2023-11-17 NOTE — Progress Notes (Signed)
 Office Visit Note   Patient: Patricia Robbins           Date of Birth: 1960/03/28           MRN: [id number] Visit Date: 11/16/2023              Requested by: Domenick Loma, NP [address] [place],  [place] [address] PCP: Pridgen, Taylar, NP  Chief Complaint  Patient presents with   Neck - Follow-up      HPI: Discussed the use of AI scribe software for clinical note transcription with the patient, who gave verbal consent to proceed.  History of Present Illness Patricia Robbins is a 63 year old female with spinal stenosis who presents with persistent neck and shoulder pain.  She experiences ongoing impingement and pain under the scapula extending to the neck, described as a pinching sensation. She reports burning pain down the medial scapular border on the left, along with numbness and tingling in the thumb, index, and long finger on the left. She also reports pain in the dorsal forearm and proximal arm.  The symptoms have persisted for approximately nine to ten weeks, beginning around August 8th. Tingling sometimes extends to the top of her head, accompanied by pulsation and numbness in the affected areas.  She previously took prednisone , which she stopped last week due to concerns about receiving a cortisone shot while on the medication. Prednisone  alleviated the pain but caused headaches. Currently, she manages pain with Tylenol  and Advil, taken twice daily. She previously required pain relief every four hours but reduced the frequency due to concerns about her right kidney.  She struggles with weight loss, attributing it to her age, a fatty liver, and the absence of a gallbladder. Despite eating healthy and attempting to exercise, she experiences a 'yo-yo' pattern of weight loss between four to eight pounds. She describes her metabolism as 'shot' and has struggled with weight loss for the past six years.  There is a family history of kidney issues affecting the right kidney,  with four family members having similar problems. She notes that her family members were not overweight, unlike her.     Assessment & Plan: Visit Diagnoses:  1. Cervical radiculopathy, acute   2. Neck pain     Plan: Assessment and Plan Assessment & Plan Cervical spinal stenosis with left-sided radiculopathy and chronic neck pain Chronic cervical spinal stenosis at C5-6 with left-sided radiculopathy causing significant discomfort and functional impairment. Conservative management with prednisone  was discontinued due to headaches. Current pain management includes Tylenol  and Advil. Risks of cortisone injections discussed and avoided. Referral to Dr. Georgina, spine surgeon, for evaluation of MRI and potential surgical intervention. - Refer to Dr. Georgina, spine surgeon, for evaluation of MRI and potential surgical intervention. - Continue current pain management with Tylenol  and Advil twice daily. - Attempt to expedite appointment with Dr. Georgina to occur within the month.      Follow-Up Instructions: No follow-ups on file.   Ortho Exam  Patient is alert, oriented, no adenopathy, well-dressed, normal affect, normal respiratory effort. Physical Exam        Imaging: No results found. No images are attached to the encounter.  Labs: No results found for: HGBA1C, ESRSEDRATE, CRP, LABURIC, REPTSTATUS, GRAMSTAIN, CULT, LABORGA   Lab Results  Component Value Date   ALBUMIN 4.1 09/02/2012    No results found for: MG No results found for: VD25OH  No results found for: PREALBUMIN    Latest Ref  Rng & Units 09/29/2023   11:51 AM 09/02/2012    8:28 PM  CBC EXTENDED  WBC 4.0 - 10.5 K/uL 6.7  7.0   RBC 3.87 - 5.11 MIL/uL 4.86  4.65   Hemoglobin 12.0 - 15.0 g/dL 85.3  86.8   HCT 63.9 - 46.0 % 43.7  39.2   Platelets 150 - 400 K/uL 232  246   NEUT# 1.7 - 7.7 K/uL  4.8   Lymph# 0.7 - 4.0 K/uL  1.8      There is no height or weight on file to calculate  BMI.  Orders:  Orders Placed This Encounter  Procedures   Ambulatory referral to Orthopedic Surgery   No orders of the defined types were placed in this encounter.    Procedures: No procedures performed  Clinical Data: No additional findings.  ROS:  All other systems negative, except as noted in the HPI. Review of Systems  Objective: Vital Signs: There were no vitals taken for this visit.  Specialty Comments:  No specialty comments available.  PMFS History: There are no active problems to display for this patient.  Past Medical History:  Diagnosis Date   Anxiety    Asthma    mild   BMI 40.0-44.9, adult (HCC)    Excessive daytime sleepiness    Family history of adverse reaction to anesthesia    mother had anaphylaxis to succinylcholine   Hepatitis 1969   Hep A   Hypertension    Hypertensive retinopathy, bilateral    Insomnia    Low back pain    Nontoxic multinodular goiter    Nontoxic uninodular goiter    Obesity    OSA (obstructive sleep apnea)    PTSD (post-traumatic stress disorder)    Renal stone    Right knee pain    Vitamin B 12 deficiency    Vitamin D deficiency     History reviewed. No pertinent family history.  Past Surgical History:  Procedure Laterality Date   CESAREAN SECTION     CESAREAN SECTION     CHOLECYSTECTOMY     TONSILLECTOMY     Social History   Occupational History   Not on file  Tobacco Use   Smoking status: Never   Smokeless tobacco: Never  Substance and Sexual Activity   Alcohol use: Yes   Drug use: No   Sexual activity: Yes    Comment: husband has had vasectomy

## 2023-11-18 ENCOUNTER — Ambulatory Visit: Admitting: Orthopedic Surgery

## 2023-11-18 ENCOUNTER — Other Ambulatory Visit (INDEPENDENT_AMBULATORY_CARE_PROVIDER_SITE_OTHER): Payer: Self-pay

## 2023-11-18 VITALS — BP 148/91 | HR 79 | Ht 68.0 in | Wt 260.0 lb

## 2023-11-18 DIAGNOSIS — M542 Cervicalgia: Secondary | ICD-10-CM

## 2023-11-18 NOTE — Progress Notes (Signed)
 Orthopedic Spine Surgery Office Note  Assessment: Patient is a 63 y.o. female with C6 radiculopathy with cervical stenosis at C5/6  Plan: - Patient has tried multiple conservative treatments over the last 10 weeks without any relief of her pain, so discussed C5/6 ACDF as a treatment option.  Covered the risks, benefits, and alternatives of this proposed procedure.  After discussion, patient wanted to proceed with nonoperative treatment.  She is going to use over-the-counter medications to try to control her pain.  If it gets worse she says she will come back and we can talk further about surgery  Patient expressed understanding of the plan and all questions were answered to the patient's satisfaction.   ___________________________________________________________________________   History:  Patient is a 63 y.o. female who presents today for cervical spine.  Patient has had about 10 weeks of left upper extremity pain.  She initially felt it behind the scapula of the left arm.  Pain then gradually migrated to worsen.  It surgical down the left arm and the lateral aspect of the arm.  She then notices in the dorsal aspect of the forearm.  It went into the radial 3 digits.  She notices numbness and tingling in the radial 3 digits.  Her worst numbness is in the index finger.  She has tried multiple conservative treatments over the 10 weeks but has not noticed any relief.  She has pain that wakes her at night.  She has had no difficulty with fine motor skills in the hands.  No unsteadiness with gait.  No bowel or bladder incontinence.  No saddle anesthesia.  There is no trauma or injury that preceded the onset of pain.  She has pain with activity and at rest.  Treatments tried: Tylenol , morphine, oral steroids, steroid injection, gabapentin, oxycodone  Review of systems: Denies fevers and chills, night sweats, unexplained weight loss, history of cancer, pain that wakes them at night  Past medical  history: Hepatitis C Anxiety HTN IBS OSA  Allergies: amoxicillin, codeine, succinylcholine, levofloxacin, clindamycin, darvocet  Past surgical history:  C-section Cholecystectomy Tonsillectomy  Social history: Denies use of nicotine product (smoking, vaping, patches, smokeless) Alcohol use: Yes, approximately 1-2 drinks per week Denies recreational drug use   Physical Exam:  BMI of 39.5  General: no acute distress, appears stated age Neurologic: alert, answering questions appropriately, following commands Respiratory: unlabored breathing on room air, symmetric chest rise Psychiatric: appropriate affect, normal cadence to speech   MSK (spine):  -Strength exam      Left  Right Grip strength                5/5  5/5 Interosseus   5/5   5/5 Wrist extension  5/5  5/5 Wrist flexion   5/5  5/5 Elbow flexion   5/5  5/5 Deltoid    5/5  5/5  -Sensory exam   Sensation intact to light touch in C5-T1 nerve distributions of bilateral upper extremities  -Brachioradialis DTR: 2/4 on the left, 2/4 on the right -Biceps DTR: 2/4 on the left, 2/4 on the right  -Spurling: Positive on the left, negative on the right -Hoffman sign: Negative bilaterally -Clonus: No beats bilaterally -Interosseous wasting: None seen -Grip and release test: Negative bilaterally -Romberg: Negative -Gait: Normal  Left shoulder exam: No pain through range of motion Right shoulder exam: No pain through range of motion  Imaging: XRs of the cervical spine from 11/18/2023 were independently reviewed and interpreted, showing C4/5 and C5/6 disc height loss.  No other significant degenerative changes seen. No evidence of instability on flexion/extension views. No fracture or dislocation.   MRI of the cervical spine from 11/08/2023 was independently reviewed and interpreted, showing central stenosis and bilateral foraminal stenosis at C5/6.  Mild foraminal stenosis at C6/7 on the left.  No other significant  stenosis. No T2 cord signal change seen.    Patient name: Patricia Robbins Patient MRN: [id number] Date of visit: 11/18/23

## 2023-12-06 ENCOUNTER — Encounter: Payer: Self-pay | Admitting: Radiology
